# Patient Record
Sex: Female | Born: 1995 | Race: White | Hispanic: No | Marital: Single | Smoking: Current every day smoker
Health system: Southern US, Community
[De-identification: ages and names within clinical notes are randomized; demographics above are authoritative.]

## PROBLEM LIST (undated history)

## (undated) DIAGNOSIS — R9431 Abnormal electrocardiogram [ECG] [EKG]: Secondary | ICD-10-CM

## (undated) HISTORY — PX: MOUTH SURGERY: SHX715

---
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
## 2016-03-03 ENCOUNTER — Emergency Department (HOSPITAL_COMMUNITY)
Admission: EM | Admit: 2016-03-03 | Discharge: 2016-03-03 | Disposition: A | Discharge status: Home / Self Care | Payer: 59 | Attending: Emergency Medicine | Admitting: Emergency Medicine

## 2016-03-03 ENCOUNTER — Encounter (HOSPITAL_COMMUNITY): Payer: Self-pay

## 2016-03-03 DIAGNOSIS — F17210 Nicotine dependence, cigarettes, uncomplicated: Secondary | ICD-10-CM | POA: Insufficient documentation

## 2016-03-03 DIAGNOSIS — Z7982 Long term (current) use of aspirin: Secondary | ICD-10-CM | POA: Diagnosis not present

## 2016-03-03 DIAGNOSIS — G43909 Migraine, unspecified, not intractable, without status migrainosus: Secondary | ICD-10-CM | POA: Diagnosis not present

## 2016-03-03 DIAGNOSIS — Z79899 Other long term (current) drug therapy: Secondary | ICD-10-CM | POA: Diagnosis not present

## 2016-03-03 DIAGNOSIS — H53149 Visual discomfort, unspecified: Secondary | ICD-10-CM | POA: Diagnosis present

## 2016-03-03 DIAGNOSIS — G43009 Migraine without aura, not intractable, without status migrainosus: Secondary | ICD-10-CM

## 2016-03-03 HISTORY — DX: Abnormal electrocardiogram (ECG) (EKG): R94.31

## 2016-03-03 MED ORDER — KETOROLAC TROMETHAMINE 30 MG/ML IJ SOLN
30.0000 mg | Freq: Once | INTRAMUSCULAR | Status: AC
  Administered 2016-03-03: 30 mg via INTRAVENOUS
  Filled 2016-03-03: qty 1

## 2016-03-03 MED ORDER — VALPROATE SODIUM 500 MG/5ML IV SOLN
500.0000 mg | Freq: Once | INTRAVENOUS | Status: AC
  Administered 2016-03-03: 500 mg via INTRAVENOUS
  Filled 2016-03-03: qty 5

## 2016-03-03 MED ORDER — SODIUM CHLORIDE 0.9 % IV BOLUS (SEPSIS)
1000.0000 mL | Freq: Once | INTRAVENOUS | Status: AC
  Administered 2016-03-03: 1000 mL via INTRAVENOUS

## 2016-03-03 MED ORDER — DIPHENHYDRAMINE HCL 50 MG/ML IJ SOLN
25.0000 mg | Freq: Once | INTRAMUSCULAR | Status: AC
  Administered 2016-03-03: 25 mg via INTRAVENOUS
  Filled 2016-03-03: qty 1

## 2016-03-03 NOTE — ED Provider Notes (Signed)
[facility]-EMERGENCY DEPT Provider Note   CSN: [id number] Arrival date & time: 03/03/16  1641  First Provider Contact:  1800       History   Chief Complaint Chief Complaint  Patient presents with  . Migraine  . Facial Pain  . Emesis    HPI RAND BOLLER is a 20 y.o. female.  Resides with a three-day headache. She did intermittent "migraines" over the last several weeks. Mom states her other daughter, the patient's sister has had similar episodes in the interval scheduled apparently does see a neurologist "some time". She describes throbbing frontal headache with light sensitivity and nausea.  Denies facial pain sinus symptoms fever or neck pain or other new or worsening symptoms.  HPI  Past Medical History:  Diagnosis Date  . Prolonged QT interval     There are no active problems to display for this patient.   History reviewed. No pertinent surgical history.  OB History    No data available       Home Medications    Prior to Admission medications   Medication Sig Start Date End Date Taking? Authorizing Provider  aspirin-acetaminophen-caffeine (EXCEDRIN MIGRAINE) [phone] MG tablet Take 1 tablet by mouth every 6 (six) hours as needed for headache.   Yes Historical Provider, MD  nadolol (CORGARD) 80 MG tablet Take 80 mg by mouth daily. 02/14/16 02/13/17 Yes Historical Provider, MD    Family History History reviewed. No pertinent family history.  Social History Social History  Substance Use Topics  . Smoking status: Current Every Day Smoker    Types: Cigarettes  . Smokeless tobacco: Never Used  . Alcohol use Yes     Allergies   Other   Review of Systems Review of Systems  Constitutional: Negative for appetite change, chills, diaphoresis, fatigue and fever.  HENT: Negative for mouth sores, sore throat and trouble swallowing.   Eyes: Positive for photophobia. Negative for visual disturbance.  Respiratory: Negative for cough, chest tightness,  shortness of breath and wheezing.   Cardiovascular: Negative for chest pain.  Gastrointestinal: Positive for nausea. Negative for abdominal distention, abdominal pain, diarrhea and vomiting.  Endocrine: Negative for polydipsia, polyphagia and polyuria.  Genitourinary: Negative for dysuria, frequency and hematuria.  Musculoskeletal: Negative for gait problem.  Skin: Negative for color change, pallor and rash.  Neurological: Positive for headaches. Negative for dizziness, syncope and light-headedness.  Hematological: Does not bruise/bleed easily.  Psychiatric/Behavioral: Negative for behavioral problems and confusion.     Physical Exam Updated Vital Signs BP 115/74 (BP Location: Left Arm)   Pulse 63   Temp 97.8 F (36.6 C)   Resp 18   LMP 02/09/2016   SpO2 100%   Physical Exam  Constitutional: She is oriented to person, place, and time. She appears well-developed and well-nourished. No distress.  HENT:  Head: Normocephalic.  Eyes: Conjunctivae are normal. Pupils are equal, round, and reactive to light. No scleral icterus.  Neck: Normal range of motion. Neck supple. No thyromegaly present.  Cardiovascular: Normal rate and regular rhythm.  Exam reveals no gallop and no friction rub.   No murmur heard. Pulmonary/Chest: Effort normal and breath sounds normal. No respiratory distress. She has no wheezes. She has no rales.  Abdominal: Soft. Bowel sounds are normal. She exhibits no distension. There is no tenderness. There is no rebound.  Musculoskeletal: Normal range of motion.  Neurological: She is alert and oriented to person, place, and time.  Skin: Skin is warm and dry. No rash noted.  Psychiatric:  She has a normal mood and affect. Her behavior is normal.     ED Treatments / Results  Labs (all labs ordered are listed, but only abnormal results are displayed) Labs Reviewed - No data to display  EKG  EKG Interpretation None       Radiology No results  found.  Procedures Procedures (including critical care time)  Medications Ordered in ED Medications  diphenhydrAMINE (BENADRYL) injection 25 mg (25 mg Intravenous Given 03/03/16 2024)  ketorolac (TORADOL) 30 MG/ML injection 30 mg (30 mg Intravenous Given 03/03/16 2025)  valproate (DEPACON) 500 mg in dextrose 5 % 50 mL IVPB (0 mg Intravenous Stopped 03/03/16 2128)  sodium chloride 0.9 % bolus 1,000 mL (0 mLs Intravenous Stopped 03/03/16 2217)     Initial Impression / Assessment and Plan / ED Course  I have reviewed the triage vital signs and the nursing notes.  Pertinent labs & imaging results that were available during my care of the patient were reviewed by me and considered in my medical decision making (see chart for details).  Clinical Course    Given IV Toradol, Benadryl, Depakote 500. Not given antiemetics because of a history of QT prolongation. After the patient was sleeping and symptom free. Discharged home. Neurology and primary care follow-up.  Final Clinical Impressions(s) / ED Diagnoses   Final diagnoses:  Migraine without aura and without status migrainosus, not intractable    New Prescriptions Discharge Medication List as of 03/03/2016 10:31 PM       Rolland Porter, MD 03/03/16 2358

## 2016-03-03 NOTE — Discharge Instructions (Signed)
Benadryl and Naproxen for any recurrence.   Follow up with planned ortho procedure
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
## 2016-04-12 ENCOUNTER — Encounter (HOSPITAL_COMMUNITY): Payer: Self-pay

## 2016-04-12 ENCOUNTER — Emergency Department (HOSPITAL_COMMUNITY)
Admission: EM | Admit: 2016-04-12 | Discharge: 2016-04-13 | Disposition: A | Discharge status: Home / Self Care | Payer: 59 | Attending: Emergency Medicine | Admitting: Emergency Medicine

## 2016-04-12 DIAGNOSIS — Z79899 Other long term (current) drug therapy: Secondary | ICD-10-CM | POA: Diagnosis not present

## 2016-04-12 DIAGNOSIS — F17210 Nicotine dependence, cigarettes, uncomplicated: Secondary | ICD-10-CM | POA: Insufficient documentation

## 2016-04-12 DIAGNOSIS — N939 Abnormal uterine and vaginal bleeding, unspecified: Secondary | ICD-10-CM

## 2016-04-12 LAB — I-STAT CHEM 8, ED
BUN: 7 mg/dL (ref 6–20)
CREATININE: 0.6 mg/dL (ref 0.44–1.00)
Calcium, Ion: 1.16 mmol/L (ref 1.15–1.40)
Chloride: 101 mmol/L (ref 101–111)
Glucose, Bld: 99 mg/dL (ref 65–99)
HEMATOCRIT: 48 % — AB (ref 36.0–46.0)
HEMOGLOBIN: 16.3 g/dL — AB (ref 12.0–15.0)
Potassium: 4 mmol/L (ref 3.5–5.1)
SODIUM: 139 mmol/L (ref 135–145)
TCO2: 27 mmol/L (ref 0–100)

## 2016-04-12 LAB — URINALYSIS, ROUTINE W REFLEX MICROSCOPIC
BILIRUBIN URINE: NEGATIVE
Glucose, UA: NEGATIVE mg/dL
KETONES UR: NEGATIVE mg/dL
NITRITE: NEGATIVE
PROTEIN: NEGATIVE mg/dL
Specific Gravity, Urine: 1.009 (ref 1.005–1.030)
pH: 6 (ref 5.0–8.0)

## 2016-04-12 LAB — URINE MICROSCOPIC-ADD ON: Bacteria, UA: NONE SEEN

## 2016-04-12 LAB — I-STAT BETA HCG BLOOD, ED (MC, WL, AP ONLY)

## 2016-04-12 LAB — WET PREP, GENITAL
CLUE CELLS WET PREP: NONE SEEN
Sperm: NONE SEEN
Trich, Wet Prep: NONE SEEN
Yeast Wet Prep HPF POC: NONE SEEN

## 2016-04-12 NOTE — ED Triage Notes (Signed)
Pt states she has been bleeding since August 19th, clots and both dark and bright red, pt states that her last regular period was the first week of August

## 2016-04-12 NOTE — ED Provider Notes (Signed)
[facility]-EMERGENCY DEPT Provider Note   CSN: [id number] Arrival date & time: 04/12/16  2004   By signing my name below, I, Christy Sartorius, attest that this documentation has been prepared under the direction and in the presence of [employer], PA-C. Electronically Signed: Christy Sartorius, ED Scribe. 04/12/16. 11:20 PM.  History   Chief Complaint Chief Complaint  Patient presents with  . Vaginal Bleeding   The history is provided by the patient and medical records. No language interpreter was used.   HPI Comments:  Sandy Rose is a 20 y.o. female with a history of polycystic ovaries brought in by ambulance, who presents to the Emergency Department complaining of constant vaginal bleeding for the last two and a half weeks.  She's used about two super tampons an hour.  She also notes she began having intermittent cramping abdominal pain 3 days before the start of the bleeding.  She denies ever having unilateral abdominal pain. She states it initially prevented her from having sex, but it is now less intense.  She states that it initially felt similar to an ovarian cyst, but resolved prior to the onset of bleeding.  She takes ibuprofen with relief of her associated centralized cramping.  Her last menstrual cycle was on 03/11/16.  She saw her OBGYN in June and had a bacterial infection that was treated with antibiotic gel.  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  Patient denies taking other medications at this time. She denies aspirin or blood thinners. No additional symptoms or complaints.    Past Medical History:  Diagnosis Date  . Prolonged QT interval     There are no active problems to display for this patient.   History reviewed. No pertinent surgical history.  OB History    No data available       Home Medications    Prior to Admission medications   Medication Sig Start  Date End Date Taking? Authorizing Provider  alum & mag hydroxide-simeth (MAALOX PLUS) 400-400-40 MG/5ML suspension Take 10 mLs by mouth every 6 (six) hours as needed for indigestion.   Yes Historical Provider, MD  anti-nausea (EMETROL) solution Take 10 mLs by mouth every 15 (fifteen) minutes as needed for nausea or vomiting.   Yes Historical Provider, MD  benzonatate (TESSALON) 100 MG capsule Take 100 mg by mouth 3 (three) times daily as needed for cough.   Yes Historical Provider, MD  diazepam (VALIUM) 5 MG/ML injection Inject 10 mg into the vein once. As needed for seizure   Yes Historical Provider, MD  folic acid (FOLVITE) 1 MG tablet Take 1 mg by mouth daily.   Yes Historical Provider, MD  guaiFENesin (MUCINEX) 600 MG 12 hr tablet Take 600 mg by mouth 2 (two) times daily.   Yes Historical Provider, MD  ibuprofen (ADVIL,MOTRIN) 200 MG tablet Take 400 mg by mouth every 4 (four) hours as needed for moderate pain.   Yes Historical Provider, MD  loperamide (IMODIUM A-D) 2 MG tablet Take 2 mg by mouth 4 (four) times daily as needed for diarrhea or loose stools.   Yes Historical Provider, MD  Melatonin 5 MG TABS Take 2 tablets by mouth daily.   Yes Historical Provider, MD  Multiple Vitamin (MULTIVITAMIN WITH MINERALS) TABS tablet Take 1 tablet by mouth daily.   Yes Historical Provider, MD  nystatin (MYCOSTATIN) 100000 UNIT/ML suspension Take 6 mLs by mouth 4 (four)  times daily.   Yes Historical Provider, MD  thiamine 100 MG tablet Take 100 mg by mouth daily.   Yes Historical Provider, MD  medroxyPROGESTERone (PROVERA) 5 MG tablet Take 1 tablet (5 mg total) by mouth daily. 04/13/16   Dahlia ClientHannah Lashante Fryberger, PA-C    Family History History reviewed. No pertinent family history.  Social History Social History  Substance Use Topics  . Smoking status: Current Every Day Smoker    Types: Cigarettes  . Smokeless tobacco: Never Used  . Alcohol use Yes     Allergies   Other   Review of Systems Review  of Systems  Constitutional: Negative for chills and fever.  Gastrointestinal: Positive for abdominal pain ( Cramping).  Genitourinary: Positive for vaginal bleeding. Negative for dysuria, flank pain, pelvic pain and vaginal discharge.  All other systems reviewed and are negative.    Physical Exam Updated Vital Signs BP 131/87 (BP Location: Right Arm)   Pulse 82   Temp 97.9 F (36.6 C) (Oral)   Resp 19   Ht 5\' 7"  (1.702 m)   Wt 122 lb 8 oz (55.6 kg)   LMP 03/08/2016   SpO2 100%   BMI 19.19 kg/m   Physical Exam  Constitutional: She appears well-developed and well-nourished. No distress.  HENT:  Head: Normocephalic and atraumatic.  Eyes: Conjunctivae are normal.  Neck: Normal range of motion.  Cardiovascular: Normal rate, regular rhythm, normal heart sounds and intact distal pulses.   No murmur heard. Pulmonary/Chest: Effort normal and breath sounds normal. No respiratory distress. She has no wheezes.  Abdominal: Soft. Bowel sounds are normal. There is no tenderness. There is no rebound and no guarding. Hernia confirmed negative in the right inguinal area and confirmed negative in the left inguinal area.  Genitourinary: Uterus normal. No labial fusion. There is no rash, tenderness or lesion on the right labia. There is no rash, tenderness or lesion on the left labia. Uterus is not deviated, not enlarged, not fixed and not tender. Cervix exhibits no motion tenderness, no discharge and no friability. Right adnexum displays no mass, no tenderness and no fullness. Left adnexum displays no mass, no tenderness and no fullness. There is bleeding ( Very small amount of red blood in the vaginal vault, no clots.) in the vagina. No erythema or tenderness in the vagina. No foreign body in the vagina. No signs of injury around the vagina. No vaginal discharge ( ) found.  Musculoskeletal: Normal range of motion. She exhibits no edema.  Lymphadenopathy:       Right: No inguinal adenopathy present.         Left: No inguinal adenopathy present.  Neurological: She is alert.  Skin: Skin is warm and dry. She is not diaphoretic. No erythema.  Psychiatric: She has a normal mood and affect.  Nursing note and vitals reviewed.    ED Treatments / Results   DIAGNOSTIC STUDIES:  Oxygen Saturation is 100% on RA, NML by my interpretation.    COORDINATION OF CARE:  11:20 PM Discussed treatment plan with pt at bedside and pt agreed to plan.  Labs (all labs ordered are listed, but only abnormal results are displayed) Labs Reviewed  WET PREP, GENITAL - Abnormal; Notable for the following:       Result Value   WBC, Wet Prep HPF POC FEW (*)    All other components within normal limits  URINALYSIS, ROUTINE W REFLEX MICROSCOPIC (NOT AT [facility]) - Abnormal; Notable for the following:    Hgb urine  dipstick LARGE (*)    Leukocytes, UA SMALL (*)    All other components within normal limits  URINE MICROSCOPIC-ADD ON - Abnormal; Notable for the following:    Squamous Epithelial / LPF 0-5 (*)    All other components within normal limits  I-STAT CHEM 8, ED - Abnormal; Notable for the following:    Hemoglobin 16.3 (*)    HCT 48.0 (*)    All other components within normal limits  I-STAT BETA HCG BLOOD, ED ([facility], [facility], [facility] ONLY)  GC/CHLAMYDIA PROBE AMP ([place]) NOT AT [facility]    Procedures Procedures (including critical care time)  Medications Ordered in ED Medications - No data to display   Initial Impression / Assessment and Plan / ED Course  I have reviewed the triage vital signs and the nursing notes.  Pertinent labs & imaging results that were available during my care of the patient were reviewed by me and considered in my medical decision making (see chart for details).  Clinical Course  Value Comment By Time  Hemoglobin: (!) 16.3 No anemia. Dahlia Client Demetry Bendickson, PA-C 09/06 2324  WBC, Wet Prep HPF POC: (!) FEW Wet prep with only a few WBCs and no clinical evidence of vaginal infection  Dierdre Forth, PA-C 09/06 2324  Leukocytes, UA: (!) SMALL Small leukocytes, but no nitrites and no urinary symptoms.  Microscopic with 0-5 WBCs Chaska Hagger, PA-C 09/06 2325   Abdomen remained soft and nontender on repeat exam.  Vital signs remained stable Dierdre Forth, PA-C 09/07 0026  I-stat hCG, quantitative: <5.0 Negative pregnancy test Dierdre Forth, PA-C 09/07 0029    Pt with vaginal bleeding. Patient reports significant amounts however minimal noted in the vaginal vault. Abdomen is soft and nontender. Pregnancy test negative. Patient without anemia. Will be given short course of medroxyprogesterone.  She is to follow-up with her OB/GYN as soon as possible.  Discussed findings with patient and mother who state understanding and are in agreement with the plan.  Final Clinical Impressions(s) / ED Diagnoses   Final diagnoses:  Vaginal bleeding    New Prescriptions New Prescriptions   MEDROXYPROGESTERONE (PROVERA) 5 MG TABLET    Take 1 tablet (5 mg total) by mouth daily.   I personally performed the services described in this documentation, which was scribed in my presence. The recorded information has been reviewed and is accurate.     Dahlia Client Telisha Zawadzki, PA-C 04/13/16 0030    Benjiman Core, MD 04/14/16 [phone]

## 2016-04-12 NOTE — ED Triage Notes (Signed)
Pt is inpatient currently at [facility] and will return there upon discharge

## 2016-04-12 NOTE — ED Triage Notes (Signed)
Per EMS, pt complains of vaginal bleeding and cramping for 2.5 weeks. Pt states the blood is bright red. [facility] is from [facility], had blood work performed a week ago, which showed hgb within normal limits. Pt has hx of polycystic ovaries.

## 2016-04-13 LAB — GC/CHLAMYDIA PROBE AMP (~~LOC~~) NOT AT ARMC
Chlamydia: NEGATIVE
NEISSERIA GONORRHEA: NEGATIVE

## 2016-04-13 MED ORDER — MEDROXYPROGESTERONE ACETATE 5 MG PO TABS: 5.0000 mg | ORAL_TABLET | Freq: Every day | ORAL | 0 refills | Status: DC

## 2016-04-13 NOTE — Discharge Instructions (Signed)
1. Medications: Hydroxyprogesterone, usual home medications 2. Treatment: rest, drink plenty of fluids,  3. Follow Up: Please followup with your OB/GYN in 7-10 days for discussion of your diagnoses and further evaluation after today's visit; if you do not have a primary care doctor use the resource guide provided to find one; Please return to the ER for worsening vaginal bleeding, development of abdominal pain or other concerns

## 2016-08-16 DIAGNOSIS — N911 Secondary amenorrhea: Secondary | ICD-10-CM | POA: Diagnosis not present

## 2016-08-31 DIAGNOSIS — N911 Secondary amenorrhea: Secondary | ICD-10-CM | POA: Diagnosis not present

## 2016-09-06 DIAGNOSIS — Z3685 Encounter for antenatal screening for Streptococcus B: Secondary | ICD-10-CM | POA: Diagnosis not present

## 2016-09-06 DIAGNOSIS — Z23 Encounter for immunization: Secondary | ICD-10-CM | POA: Diagnosis not present

## 2016-09-06 DIAGNOSIS — Z3401 Encounter for supervision of normal first pregnancy, first trimester: Secondary | ICD-10-CM | POA: Diagnosis not present

## 2016-09-06 DIAGNOSIS — Z3480 Encounter for supervision of other normal pregnancy, unspecified trimester: Secondary | ICD-10-CM | POA: Diagnosis not present

## 2016-09-06 LAB — OB RESULTS CONSOLE ANTIBODY SCREEN: Antibody Screen: NEGATIVE

## 2016-09-06 LAB — OB RESULTS CONSOLE ABO/RH: RH Type: POSITIVE

## 2016-09-06 LAB — OB RESULTS CONSOLE HEPATITIS B SURFACE ANTIGEN: Hepatitis B Surface Ag: NEGATIVE

## 2016-09-06 LAB — OB RESULTS CONSOLE RUBELLA ANTIBODY, IGM: RUBELLA: IMMUNE

## 2016-09-06 LAB — OB RESULTS CONSOLE HIV ANTIBODY (ROUTINE TESTING): HIV: NONREACTIVE

## 2016-09-06 LAB — OB RESULTS CONSOLE RPR: RPR: NONREACTIVE

## 2016-09-12 DIAGNOSIS — I4581 Long QT syndrome: Secondary | ICD-10-CM | POA: Diagnosis not present

## 2016-09-14 DIAGNOSIS — Z01419 Encounter for gynecological examination (general) (routine) without abnormal findings: Secondary | ICD-10-CM | POA: Diagnosis not present

## 2016-09-14 DIAGNOSIS — Z113 Encounter for screening for infections with a predominantly sexual mode of transmission: Secondary | ICD-10-CM | POA: Diagnosis not present

## 2016-09-14 DIAGNOSIS — Z3A10 10 weeks gestation of pregnancy: Secondary | ICD-10-CM | POA: Diagnosis not present

## 2016-09-14 DIAGNOSIS — Z3400 Encounter for supervision of normal first pregnancy, unspecified trimester: Secondary | ICD-10-CM | POA: Diagnosis not present

## 2016-09-14 DIAGNOSIS — Z3480 Encounter for supervision of other normal pregnancy, unspecified trimester: Secondary | ICD-10-CM | POA: Diagnosis not present

## 2016-09-28 DIAGNOSIS — Z3682 Encounter for antenatal screening for nuchal translucency: Secondary | ICD-10-CM | POA: Diagnosis not present

## 2016-09-28 DIAGNOSIS — Z3491 Encounter for supervision of normal pregnancy, unspecified, first trimester: Secondary | ICD-10-CM | POA: Diagnosis not present

## 2016-09-28 DIAGNOSIS — Z13228 Encounter for screening for other metabolic disorders: Secondary | ICD-10-CM | POA: Diagnosis not present

## 2016-09-28 DIAGNOSIS — Z36 Encounter for antenatal screening for chromosomal anomalies: Secondary | ICD-10-CM | POA: Diagnosis not present

## 2016-10-30 ENCOUNTER — Encounter: Payer: Self-pay | Admitting: Emergency Medicine

## 2016-10-30 ENCOUNTER — Emergency Department (HOSPITAL_COMMUNITY)
Admission: EM | Admit: 2016-10-30 | Discharge: 2016-10-30 | Disposition: A | Discharge status: Home / Self Care | Payer: BLUE CROSS/BLUE SHIELD | Attending: Emergency Medicine | Admitting: Emergency Medicine

## 2016-10-30 DIAGNOSIS — O9989 Other specified diseases and conditions complicating pregnancy, childbirth and the puerperium: Secondary | ICD-10-CM | POA: Diagnosis not present

## 2016-10-30 DIAGNOSIS — R200 Anesthesia of skin: Secondary | ICD-10-CM | POA: Diagnosis present

## 2016-10-30 DIAGNOSIS — G510 Bell's palsy: Secondary | ICD-10-CM

## 2016-10-30 DIAGNOSIS — F17210 Nicotine dependence, cigarettes, uncomplicated: Secondary | ICD-10-CM | POA: Insufficient documentation

## 2016-10-30 DIAGNOSIS — Z79899 Other long term (current) drug therapy: Secondary | ICD-10-CM | POA: Diagnosis not present

## 2016-10-30 DIAGNOSIS — Z3A16 16 weeks gestation of pregnancy: Secondary | ICD-10-CM | POA: Diagnosis not present

## 2016-10-30 MED ORDER — VALACYCLOVIR HCL 1 G PO TABS: 1000.0000 mg | ORAL_TABLET | Freq: Three times a day (TID) | ORAL | 0 refills | Status: AC

## 2016-10-30 MED ORDER — HYPROMELLOSE (GONIOSCOPIC) 2.5 % OP SOLN: 1.0000 [drp] | OPHTHALMIC | 2 refills | Status: DC | PRN

## 2016-10-30 NOTE — ED Triage Notes (Signed)
Pt reports right facial numbness and the right side of her face is drooping, onset 2 days ago. Right eyelid does appear lower than left. Denies weakness, numbness or tingling to arms or legs. She states she feels like it is affecting her speech.

## 2016-10-30 NOTE — ED Notes (Signed)
Pts eyes appear different when she closes both of them and she reports food has been tasting different today. Dr. Effie ShyWentz called and informed of pts symptoms. He states it sounds like Bells Palsy. No new verbal orders at this time.

## 2016-10-30 NOTE — ED Notes (Signed)
Pt reports she is [redacted] weeks pregnant.

## 2016-10-30 NOTE — ED Provider Notes (Signed)
[facility]-EMERGENCY DEPT Provider Note   CSN: [id number] Arrival date & time: 10/30/16  1746  History   Chief Complaint Chief Complaint  Patient presents with  . Numbness    HPI Sandy Rose is a 21 y.o. female who presents to the Emergency Department with worsening, constant right-sided facial paralysis that began suddenly 2 days ago. No aggravating or alleviating factors. She denies HA, visual changes, otalgia, SOB, CP, abdominal pain, or rash. No recent illnesses or tick bites. No treatment PTA.   PMH includes prolonged QT syndrome. NKA. She is currently [redacted] weeks pregnant. OBGYN is Dr. Renaldo Fiddler at [facility].    HPI  Past Medical History:  Diagnosis Date  . Prolonged QT interval     There are no active problems to display for this patient.   No past surgical history on file.  OB History    Gravida Para Term Preterm AB Living   1             SAB TAB Ectopic Multiple Live Births                   Home Medications    Prior to Admission medications   Medication Sig Start Date End Date Taking? Authorizing Provider  alum & mag hydroxide-simeth (MAALOX PLUS) 400-400-40 MG/5ML suspension Take 10 mLs by mouth every 6 (six) hours as needed for indigestion.    Historical Provider, MD  anti-nausea (EMETROL) solution Take 10 mLs by mouth every 15 (fifteen) minutes as needed for nausea or vomiting.    Historical Provider, MD  benzonatate (TESSALON) 100 MG capsule Take 100 mg by mouth 3 (three) times daily as needed for cough.    Historical Provider, MD  diazepam (VALIUM) 5 MG/ML injection Inject 10 mg into the vein once. As needed for seizure    Historical Provider, MD  folic acid (FOLVITE) 1 MG tablet Take 1 mg by mouth daily.    Historical Provider, MD  guaiFENesin (MUCINEX) 600 MG 12 hr tablet Take 600 mg by mouth 2 (two) times daily.    Historical Provider, MD  hydroxypropyl methylcellulose / hypromellose (ISOPTO TEARS / GONIOVISC) 2.5 % ophthalmic solution Place 1  drop into the right eye as needed for dry eyes. 10/30/16   Lilya Smitherman Conan Bowens, PA-C  ibuprofen (ADVIL,MOTRIN) 200 MG tablet Take 400 mg by mouth every 4 (four) hours as needed for moderate pain.    Historical Provider, MD  loperamide (IMODIUM A-D) 2 MG tablet Take 2 mg by mouth 4 (four) times daily as needed for diarrhea or loose stools.    Historical Provider, MD  medroxyPROGESTERone (PROVERA) 5 MG tablet Take 1 tablet (5 mg total) by mouth daily. 04/13/16   Hannah Muthersbaugh, PA-C  Melatonin 5 MG TABS Take 2 tablets by mouth daily.    Historical Provider, MD  Multiple Vitamin (MULTIVITAMIN WITH MINERALS) TABS tablet Take 1 tablet by mouth daily.    Historical Provider, MD  nystatin (MYCOSTATIN) 100000 UNIT/ML suspension Take 6 mLs by mouth 4 (four) times daily.    Historical Provider, MD  thiamine 100 MG tablet Take 100 mg by mouth daily.    Historical Provider, MD  valACYclovir (VALTREX) 1000 MG tablet Take 1 tablet (1,000 mg total) by mouth 3 (three) times daily. 10/30/16 11/06/16  Altonio Schwertner Conan Bowens, PA-C    Family History No family history on file.  Social History Social History  Substance Use Topics  . Smoking status: Current Every Day Smoker  Types: Cigarettes  . Smokeless tobacco: Never Used  . Alcohol use Yes     Allergies   Other   Review of Systems Review of Systems  Constitutional: Negative for chills and fever.  HENT: Positive for congestion. Negative for ear pain, mouth sores, rhinorrhea and sinus pain.   Eyes: Negative for pain.  Respiratory: Negative for shortness of breath.   Gastrointestinal: Negative for abdominal pain, diarrhea, nausea and vomiting.  Genitourinary: Negative for dysuria.  Musculoskeletal: Negative for neck stiffness.  Skin: Negative for rash.  Allergic/Immunologic: Negative for immunocompromised state.  Neurological: Positive for facial asymmetry and numbness. Negative for dizziness, syncope, speech difficulty, weakness, light-headedness and  headaches.  Psychiatric/Behavioral: Negative for confusion.   Physical Exam Updated Vital Signs BP (!) 98/57 (BP Location: Right Arm)   Pulse 62   Temp 98.4 F (36.9 C) (Oral)   Resp 18   LMP 06/27/2016   SpO2 100%   Physical Exam  Constitutional: She is oriented to person, place, and time. She appears well-developed and well-nourished. No distress.  HENT:  Head: Normocephalic and atraumatic.  Right Ear: Hearing, tympanic membrane and external ear normal.  Left Ear: Hearing, tympanic membrane and external ear normal.  Eyes: Conjunctivae and EOM are normal. Pupils are equal, round, and reactive to light.  Neck: Neck supple.  Cardiovascular: Normal rate, regular rhythm and normal heart sounds.  Exam reveals no gallop and no friction rub.   No murmur heard. Pulmonary/Chest: Effort normal.  Abdominal: Soft. There is no tenderness.  Musculoskeletal: She exhibits no edema.  Neurological: She is alert and oriented to person, place, and time. A cranial nerve deficit and sensory deficit is present. Coordination normal.  Right-sided deficit to sharp and light sensation over the distribution of the right Facial nerve. Decreased smile on the right. The patient is unable to close the right eye. Decreased ability to raise the right eyebrow. No deficits to the left side.   5/5 strength to the bilateral upper and lower extremities. Normal gait. No other focal neuro deficits.   Skin: Skin is warm and dry.  Psychiatric: She has a normal mood and affect.  Nursing note and vitals reviewed.  ED Treatments / Results  Labs (all labs ordered are listed, but only abnormal results are displayed) Labs Reviewed - No data to display  EKG  EKG Interpretation None      Radiology No results found.  Procedures Procedures (including critical care time)  Medications Ordered in ED Medications - No data to display  Initial Impression / Assessment and Plan / ED Course  I have reviewed the triage  vital signs and the nursing notes.  Pertinent labs & imaging results that were available during my care of the patient were reviewed by me and considered in my medical decision making (see chart for details).     21 y.o. female with Bell's Palsy. Low suspicion for central nervous system lesion based on physical exam. She is currently [redacted] weeks pregnant. Will discharge with valacyclovir, artificial tears, and follow-up with OBGYN in one week. Discussed the patient with Dr. Erma HeritageIsaacs who was agreeable with the plan. Discussed reasons to return to the Emergency Department with the patient. She acknowledged understanding and agreement to the plan.   Final Clinical Impressions(s) / ED Diagnoses   Final diagnoses:  Bell's palsy    New Prescriptions Discharge Medication List as of 10/30/2016  8:07 PM    START taking these medications   Details  hydroxypropyl methylcellulose / hypromellose (ISOPTO  TEARS / GONIOVISC) 2.5 % ophthalmic solution Place 1 drop into the right eye as needed for dry eyes., Starting Mon 10/30/2016, Print    valACYclovir (VALTREX) 1000 MG tablet Take 1 tablet (1,000 mg total) by mouth 3 (three) times daily., Starting Mon 10/30/2016, Until Mon 11/06/2016, Print         Lezly Rumpf Conan Bowens, PA-C 10/31/16 0011    Shaune Pollack, MD 10/31/16 [phone]

## 2016-11-16 DIAGNOSIS — Z363 Encounter for antenatal screening for malformations: Secondary | ICD-10-CM | POA: Diagnosis not present

## 2016-11-23 DIAGNOSIS — O358XX0 Maternal care for other (suspected) fetal abnormality and damage, not applicable or unspecified: Secondary | ICD-10-CM | POA: Diagnosis not present

## 2016-11-23 DIAGNOSIS — I4581 Long QT syndrome: Secondary | ICD-10-CM | POA: Diagnosis not present

## 2016-11-23 DIAGNOSIS — O99412 Diseases of the circulatory system complicating pregnancy, second trimester: Secondary | ICD-10-CM | POA: Diagnosis not present

## 2016-11-23 DIAGNOSIS — Z3A20 20 weeks gestation of pregnancy: Secondary | ICD-10-CM | POA: Diagnosis not present

## 2016-11-24 DIAGNOSIS — J019 Acute sinusitis, unspecified: Secondary | ICD-10-CM | POA: Diagnosis not present

## 2016-12-19 DIAGNOSIS — O4402 Placenta previa specified as without hemorrhage, second trimester: Secondary | ICD-10-CM | POA: Diagnosis not present

## 2016-12-19 DIAGNOSIS — Z3A23 23 weeks gestation of pregnancy: Secondary | ICD-10-CM | POA: Diagnosis not present

## 2017-01-05 DIAGNOSIS — N760 Acute vaginitis: Secondary | ICD-10-CM | POA: Diagnosis not present

## 2017-01-08 DIAGNOSIS — N760 Acute vaginitis: Secondary | ICD-10-CM | POA: Diagnosis not present

## 2017-01-08 DIAGNOSIS — R8271 Bacteriuria: Secondary | ICD-10-CM | POA: Diagnosis not present

## 2017-01-17 DIAGNOSIS — Z3480 Encounter for supervision of other normal pregnancy, unspecified trimester: Secondary | ICD-10-CM | POA: Diagnosis not present

## 2017-02-22 ENCOUNTER — Ambulatory Visit (INDEPENDENT_AMBULATORY_CARE_PROVIDER_SITE_OTHER): Payer: Self-pay | Admitting: Pediatrics

## 2017-02-22 DIAGNOSIS — Z7681 Expectant parent(s) prebirth pediatrician visit: Secondary | ICD-10-CM

## 2017-02-27 NOTE — Progress Notes (Signed)
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  Will need to contact [facility] for guidance.  Cord blood for mutation specific testing at birth, EKG at birth and 24hrs and after, likely starting beta blocker.   Z76.81

## 2017-03-05 DIAGNOSIS — O365930 Maternal care for other known or suspected poor fetal growth, third trimester, not applicable or unspecified: Secondary | ICD-10-CM | POA: Diagnosis not present

## 2017-03-05 DIAGNOSIS — Z3A34 34 weeks gestation of pregnancy: Secondary | ICD-10-CM | POA: Diagnosis not present

## 2017-03-20 ENCOUNTER — Inpatient Hospital Stay (HOSPITAL_COMMUNITY): Payer: BLUE CROSS/BLUE SHIELD

## 2017-03-20 ENCOUNTER — Encounter (HOSPITAL_COMMUNITY): Payer: Self-pay

## 2017-03-20 ENCOUNTER — Inpatient Hospital Stay (EMERGENCY_DEPARTMENT_HOSPITAL)
Admission: AD | Admit: 2017-03-20 | Discharge: 2017-03-20 | Disposition: A | Discharge status: Home / Self Care | Payer: BLUE CROSS/BLUE SHIELD | Source: Ambulatory Visit | Attending: Obstetrics and Gynecology | Admitting: Obstetrics and Gynecology

## 2017-03-20 DIAGNOSIS — Z881 Allergy status to other antibiotic agents status: Secondary | ICD-10-CM | POA: Insufficient documentation

## 2017-03-20 DIAGNOSIS — O368390 Maternal care for abnormalities of the fetal heart rate or rhythm, unspecified trimester, not applicable or unspecified: Secondary | ICD-10-CM | POA: Diagnosis not present

## 2017-03-20 DIAGNOSIS — Z91040 Latex allergy status: Secondary | ICD-10-CM | POA: Diagnosis not present

## 2017-03-20 DIAGNOSIS — O99334 Smoking (tobacco) complicating childbirth: Secondary | ICD-10-CM | POA: Diagnosis not present

## 2017-03-20 DIAGNOSIS — Z3480 Encounter for supervision of other normal pregnancy, unspecified trimester: Secondary | ICD-10-CM | POA: Diagnosis not present

## 2017-03-20 DIAGNOSIS — F17210 Nicotine dependence, cigarettes, uncomplicated: Secondary | ICD-10-CM | POA: Diagnosis not present

## 2017-03-20 DIAGNOSIS — O99333 Smoking (tobacco) complicating pregnancy, third trimester: Secondary | ICD-10-CM | POA: Insufficient documentation

## 2017-03-20 DIAGNOSIS — Z3A36 36 weeks gestation of pregnancy: Secondary | ICD-10-CM | POA: Diagnosis not present

## 2017-03-20 DIAGNOSIS — O368330 Maternal care for abnormalities of the fetal heart rate or rhythm, third trimester, not applicable or unspecified: Secondary | ICD-10-CM | POA: Diagnosis not present

## 2017-03-20 DIAGNOSIS — Z3A37 37 weeks gestation of pregnancy: Secondary | ICD-10-CM | POA: Diagnosis not present

## 2017-03-20 DIAGNOSIS — O368993 Maternal care for other specified fetal problems, unspecified trimester, fetus 3: Secondary | ICD-10-CM

## 2017-03-20 DIAGNOSIS — Z3689 Encounter for other specified antenatal screening: Secondary | ICD-10-CM

## 2017-03-20 DIAGNOSIS — O9942 Diseases of the circulatory system complicating childbirth: Secondary | ICD-10-CM | POA: Diagnosis not present

## 2017-03-20 DIAGNOSIS — Z9109 Other allergy status, other than to drugs and biological substances: Secondary | ICD-10-CM

## 2017-03-20 DIAGNOSIS — I4581 Long QT syndrome: Secondary | ICD-10-CM | POA: Diagnosis not present

## 2017-03-20 DIAGNOSIS — O365930 Maternal care for other known or suspected poor fetal growth, third trimester, not applicable or unspecified: Secondary | ICD-10-CM

## 2017-03-20 DIAGNOSIS — Z113 Encounter for screening for infections with a predominantly sexual mode of transmission: Secondary | ICD-10-CM | POA: Diagnosis not present

## 2017-03-20 DIAGNOSIS — I341 Nonrheumatic mitral (valve) prolapse: Secondary | ICD-10-CM | POA: Diagnosis not present

## 2017-03-20 NOTE — Discharge Instructions (Signed)

## 2017-03-20 NOTE — MAU Note (Signed)
Patient seen in OB office. FHR 5 min deceleration in office. Sent for prolonged monitoring.  Patient denies any complaints at this time.

## 2017-03-20 NOTE — MAU Provider Note (Signed)
History     CSN: [id number]  Arrival date and time: 03/20/17 1759  First Provider Initiated Contact with Patient 03/20/17 1834      Chief Complaint  Patient presents with  . Non-stress Test   HPI Sandy Rose is a 21 y.o. G1P0 at 7258w6d who presents for prolonged monitoring. Patient in office today; had 5 minute prolonged decel on NST. States ultrasound today showed baby measuring same as 2 weeks ago (4lb 8oz). Per office nurse who called report, patient IUGR & measuring 2nd percentile.  Denies abdominal pain, vaginal bleeding, or LOF. Positive fetal movement.    OB History    Gravida Para Term Preterm AB Living   1             SAB TAB Ectopic Multiple Live Births                  Past Medical History:  Diagnosis Date  . Prolonged QT interval     Past Surgical History:  Procedure Laterality Date  . MOUTH SURGERY      No family history on file.  Social History  Substance Use Topics  . Smoking status: Current Every Day Smoker    Packs/day: 0.50    Types: Cigarettes  . Smokeless tobacco: Never Used  . Alcohol use Yes    Allergies:  Allergies  Allergen Reactions  . Erythromycin     Cardiac arrest  . Other Other (See Comments)    Long Q-T syndrome - Please access web site for list of drugs to avoid:  http://www.sads.org/living-with-sads/Drugs-[email or web address]-Avoid?gclid=[email or web address]    Prescriptions Prior to Admission  Medication Sig Dispense Refill Last Dose  . nadolol (CORGARD) 80 MG tablet Take 80 mg by mouth daily.   03/19/2017 at 1800  . prenatal vitamin w/FE, FA (NATACHEW) 29-1 MG CHEW chewable tablet Chew 1 tablet by mouth 2 (two) times daily.     Marland Kitchen. alum & mag hydroxide-simeth (MAALOX PLUS) 400-400-40 MG/5ML suspension Take 10 mLs by mouth every 6 (six) hours as needed for indigestion.   unknown at unknown  . anti-nausea (EMETROL) solution Take 10 mLs by mouth every 15 (fifteen) minutes as needed for nausea or vomiting.   unknown at unknown  .  benzonatate (TESSALON) 100 MG capsule Take 100 mg by mouth 3 (three) times daily as needed for cough.   unknown at unknown  . diazepam (VALIUM) 5 MG/ML injection Inject 10 mg into the vein once. As needed for seizure   unknown at unknown  . folic acid (FOLVITE) 1 MG tablet Take 1 mg by mouth daily.   04/12/2016 at 0900  . guaiFENesin (MUCINEX) 600 MG 12 hr tablet Take 600 mg by mouth 2 (two) times daily.   unknown at unknown  . hydroxypropyl methylcellulose / hypromellose (ISOPTO TEARS / GONIOVISC) 2.5 % ophthalmic solution Place 1 drop into the right eye as needed for dry eyes. 15 mL 2   . ibuprofen (ADVIL,MOTRIN) 200 MG tablet Take 400 mg by mouth every 4 (four) hours as needed for moderate pain.   04/12/2016 at 0900  . loperamide (IMODIUM A-D) 2 MG tablet Take 2 mg by mouth 4 (four) times daily as needed for diarrhea or loose stools.   unknown at unknown  . medroxyPROGESTERone (PROVERA) 5 MG tablet Take 1 tablet (5 mg total) by mouth daily. 5 tablet 0   . Melatonin 5 MG TABS Take 2 tablets by mouth daily.   04/11/2016 at 2100  . Multiple Vitamin (MULTIVITAMIN WITH MINERALS)  TABS tablet Take 1 tablet by mouth daily.   04/12/2016 at 0900  . nystatin (MYCOSTATIN) 100000 UNIT/ML suspension Take 6 mLs by mouth 4 (four) times daily.   04/11/2016 at 2100  . thiamine 100 MG tablet Take 100 mg by mouth daily.   04/12/2016 at 0900    Review of Systems  Constitutional: Negative.   Gastrointestinal: Negative.   Genitourinary: Negative.    Physical Exam   Blood pressure (!) 120/59, pulse 77, temperature 97.9 F (36.6 C), temperature source Oral, resp. rate 17, last menstrual period 06/27/2016, SpO2 100 %.  Physical Exam  Nursing note and vitals reviewed. Constitutional: She is oriented to person, place, and time. She appears well-developed and well-nourished. No distress.  HENT:  Head: Normocephalic and atraumatic.  Eyes: Conjunctivae are normal. Right eye exhibits discharge. Left eye exhibits no discharge.  No scleral icterus.  Neck: Normal range of motion.  Respiratory: Effort normal. No respiratory distress.  Neurological: She is alert and oriented to person, place, and time.  Skin: She is not diaphoretic.  Psychiatric: She has a normal mood and affect. Her behavior is normal. Judgment and thought content normal.   Fetal Tracing:  Baseline: 120 Variability: moderate Accelerations: 15x15 Decelerations: none  Toco: irr ctx MAU Course  Procedures No results found for this or any previous visit (from the past 24 hour(s)).  MDM Reactive NST S/w Dr. Rana Snare. Will get BPP & cord doppler  BPP 8/8 with normal AFI. Normal cord doppler study. FHT reactive upon return from ultrasound.  Reviewed results and tracing with Dr. Rana Snare. Ok to discharge home. Pt to f/u in office tomorrow for NST Assessment and Plan  A: 1. NST (non-stress test) reactive   2. Fetal heart rate decelerations affecting management of mother   3. [redacted] weeks gestation of pregnancy   4. IUGR (intrauterine growth restriction) affecting care of mother, third trimester, not applicable or unspecified fetus    P; Discharge home Discussed reasons to return to MAU F/u in office tomorrow for NST -- inform them that BPP & doppler study normal tonight

## 2017-03-21 DIAGNOSIS — Z3A37 37 weeks gestation of pregnancy: Secondary | ICD-10-CM | POA: Diagnosis not present

## 2017-03-21 DIAGNOSIS — O368330 Maternal care for abnormalities of the fetal heart rate or rhythm, third trimester, not applicable or unspecified: Secondary | ICD-10-CM | POA: Diagnosis not present

## 2017-03-23 ENCOUNTER — Inpatient Hospital Stay (HOSPITAL_COMMUNITY): Payer: BLUE CROSS/BLUE SHIELD | Admitting: Anesthesiology

## 2017-03-23 ENCOUNTER — Encounter (HOSPITAL_COMMUNITY): Payer: Self-pay | Admitting: *Deleted

## 2017-03-23 ENCOUNTER — Encounter (HOSPITAL_COMMUNITY)
Admission: AD | Disposition: A | Discharge status: Home / Self Care | Payer: Self-pay | Source: Ambulatory Visit | Attending: Obstetrics and Gynecology

## 2017-03-23 ENCOUNTER — Inpatient Hospital Stay (HOSPITAL_COMMUNITY)
Admission: AD | Admit: 2017-03-23 | Discharge: 2017-03-26 | DRG: 765 | Disposition: A | Discharge status: Home / Self Care | Payer: BLUE CROSS/BLUE SHIELD | Source: Ambulatory Visit | Attending: Obstetrics and Gynecology | Admitting: Obstetrics and Gynecology

## 2017-03-23 DIAGNOSIS — O99334 Smoking (tobacco) complicating childbirth: Secondary | ICD-10-CM | POA: Diagnosis present

## 2017-03-23 DIAGNOSIS — F17210 Nicotine dependence, cigarettes, uncomplicated: Secondary | ICD-10-CM | POA: Diagnosis present

## 2017-03-23 DIAGNOSIS — Z3A37 37 weeks gestation of pregnancy: Secondary | ICD-10-CM | POA: Diagnosis not present

## 2017-03-23 DIAGNOSIS — I4581 Long QT syndrome: Secondary | ICD-10-CM | POA: Diagnosis present

## 2017-03-23 DIAGNOSIS — Z91040 Latex allergy status: Secondary | ICD-10-CM | POA: Diagnosis not present

## 2017-03-23 DIAGNOSIS — O9942 Diseases of the circulatory system complicating childbirth: Secondary | ICD-10-CM | POA: Diagnosis present

## 2017-03-23 DIAGNOSIS — Z3490 Encounter for supervision of normal pregnancy, unspecified, unspecified trimester: Secondary | ICD-10-CM

## 2017-03-23 DIAGNOSIS — O365930 Maternal care for other known or suspected poor fetal growth, third trimester, not applicable or unspecified: Secondary | ICD-10-CM | POA: Diagnosis present

## 2017-03-23 DIAGNOSIS — I341 Nonrheumatic mitral (valve) prolapse: Secondary | ICD-10-CM | POA: Diagnosis present

## 2017-03-23 LAB — ABO/RH: ABO/RH(D): O POS

## 2017-03-23 LAB — TYPE AND SCREEN
ABO/RH(D): O POS
ANTIBODY SCREEN: NEGATIVE

## 2017-03-23 LAB — CBC
HEMATOCRIT: 34.2 % — AB (ref 36.0–46.0)
HEMOGLOBIN: 11.5 g/dL — AB (ref 12.0–15.0)
MCH: 29.3 pg (ref 26.0–34.0)
MCHC: 33.6 g/dL (ref 30.0–36.0)
MCV: 87 fL (ref 78.0–100.0)
Platelets: 246 10*3/uL (ref 150–400)
RBC: 3.93 MIL/uL (ref 3.87–5.11)
RDW: 12.9 % (ref 11.5–15.5)
WBC: 8.5 10*3/uL (ref 4.0–10.5)

## 2017-03-23 SURGERY — Surgical Case
Anesthesia: Spinal | Site: Abdomen | Wound class: Clean Contaminated

## 2017-03-23 MED ORDER — DEXTROSE 5 % IV SOLN: 2.0000 g | INTRAVENOUS | Status: DC

## 2017-03-23 MED ORDER — SENNOSIDES-DOCUSATE SODIUM 8.6-50 MG PO TABS
2.0000 | ORAL_TABLET | ORAL | Status: DC
  Administered 2017-03-24 – 2017-03-26 (×3): 2 via ORAL
  Filled 2017-03-23 (×3): qty 2

## 2017-03-23 MED ORDER — MISOPROSTOL 25 MCG QUARTER TABLET: 25.0000 ug | ORAL_TABLET | ORAL | Status: DC | PRN

## 2017-03-23 MED ORDER — FENTANYL CITRATE (PF) 100 MCG/2ML IJ SOLN
INTRAMUSCULAR | Status: DC | PRN
  Administered 2017-03-23: 50 ug via INTRAVENOUS
  Administered 2017-03-23: 40 ug via INTRAVENOUS
  Administered 2017-03-23: 10 ug via INTRATHECAL

## 2017-03-23 MED ORDER — SCOPOLAMINE 1 MG/3DAYS TD PT72
1.0000 | MEDICATED_PATCH | TRANSDERMAL | Status: DC
  Administered 2017-03-23: 1.5 mg via TRANSDERMAL

## 2017-03-23 MED ORDER — ONDANSETRON HCL 4 MG/2ML IJ SOLN
INTRAMUSCULAR | Status: AC
  Filled 2017-03-23: qty 2

## 2017-03-23 MED ORDER — FENTANYL CITRATE (PF) 100 MCG/2ML IJ SOLN
INTRAMUSCULAR | Status: AC
  Filled 2017-03-23: qty 2

## 2017-03-23 MED ORDER — SCOPOLAMINE 1 MG/3DAYS TD PT72: 1.0000 | MEDICATED_PATCH | TRANSDERMAL | Status: DC

## 2017-03-23 MED ORDER — NADOLOL 80 MG PO TABS
80.0000 mg | ORAL_TABLET | Freq: Every day | ORAL | Status: DC
  Administered 2017-03-24 – 2017-03-26 (×3): 80 mg via ORAL
  Filled 2017-03-23 (×3): qty 1

## 2017-03-23 MED ORDER — OXYCODONE-ACETAMINOPHEN 5-325 MG PO TABS
2.0000 | ORAL_TABLET | ORAL | Status: DC | PRN
  Administered 2017-03-24 (×2): 2 via ORAL
  Filled 2017-03-23 (×2): qty 2

## 2017-03-23 MED ORDER — LACTATED RINGERS IV SOLN
INTRAVENOUS | Status: DC | PRN
  Administered 2017-03-23: 15:00:00 via INTRAVENOUS

## 2017-03-23 MED ORDER — WITCH HAZEL-GLYCERIN EX PADS: 1.0000 "application " | MEDICATED_PAD | CUTANEOUS | Status: DC | PRN

## 2017-03-23 MED ORDER — MORPHINE SULFATE (PF) 0.5 MG/ML IJ SOLN
INTRAMUSCULAR | Status: AC
  Filled 2017-03-23: qty 10

## 2017-03-23 MED ORDER — SODIUM CHLORIDE 0.9 % IV SOLN: 250.0000 mL | INTRAVENOUS | Status: DC

## 2017-03-23 MED ORDER — SIMETHICONE 80 MG PO CHEW: 80.0000 mg | CHEWABLE_TABLET | ORAL | Status: DC | PRN

## 2017-03-23 MED ORDER — DEXTROSE IN LACTATED RINGERS 5 % IV SOLN
INTRAVENOUS | Status: DC
  Administered 2017-03-23: 20:00:00 via INTRAVENOUS

## 2017-03-23 MED ORDER — SIMETHICONE 80 MG PO CHEW
80.0000 mg | CHEWABLE_TABLET | ORAL | Status: DC
  Administered 2017-03-24 – 2017-03-26 (×3): 80 mg via ORAL
  Filled 2017-03-23 (×3): qty 1

## 2017-03-23 MED ORDER — DIPHENHYDRAMINE HCL 25 MG PO CAPS: 25.0000 mg | ORAL_CAPSULE | Freq: Four times a day (QID) | ORAL | Status: DC | PRN

## 2017-03-23 MED ORDER — ACETAMINOPHEN 325 MG PO TABS
650.0000 mg | ORAL_TABLET | ORAL | Status: DC | PRN
  Administered 2017-03-23: 650 mg via ORAL
  Filled 2017-03-23: qty 2

## 2017-03-23 MED ORDER — OXYCODONE-ACETAMINOPHEN 5-325 MG PO TABS: 2.0000 | ORAL_TABLET | ORAL | Status: DC | PRN

## 2017-03-23 MED ORDER — TETANUS-DIPHTH-ACELL PERTUSSIS 5-2.5-18.5 LF-MCG/0.5 IM SUSP: 0.5000 mL | Freq: Once | INTRAMUSCULAR | Status: DC

## 2017-03-23 MED ORDER — NALBUPHINE HCL 10 MG/ML IJ SOLN: 5.0000 mg | INTRAMUSCULAR | Status: DC | PRN

## 2017-03-23 MED ORDER — OXYTOCIN 40 UNITS IN LACTATED RINGERS INFUSION - SIMPLE MED
2.5000 [IU]/h | INTRAVENOUS | Status: AC
Start: 2017-03-23 — End: 2017-03-24

## 2017-03-23 MED ORDER — BISACODYL 10 MG RE SUPP: 10.0000 mg | Freq: Every day | RECTAL | Status: DC | PRN

## 2017-03-23 MED ORDER — OXYTOCIN BOLUS FROM INFUSION
500.0000 mL | Freq: Once | INTRAVENOUS | Status: AC
  Administered 2017-03-23: 20 [IU] via INTRAVENOUS

## 2017-03-23 MED ORDER — SODIUM CHLORIDE 0.9% FLUSH: 3.0000 mL | INTRAVENOUS | Status: DC | PRN

## 2017-03-23 MED ORDER — SODIUM CHLORIDE 0.9 % IR SOLN
Status: DC | PRN
  Administered 2017-03-23: 1

## 2017-03-23 MED ORDER — MENTHOL 3 MG MT LOZG: 1.0000 | LOZENGE | OROMUCOSAL | Status: DC | PRN

## 2017-03-23 MED ORDER — SCOPOLAMINE 1 MG/3DAYS TD PT72
MEDICATED_PATCH | TRANSDERMAL | Status: AC
  Filled 2017-03-23: qty 1

## 2017-03-23 MED ORDER — COCONUT OIL OIL: 1.0000 "application " | TOPICAL_OIL | Status: DC | PRN

## 2017-03-23 MED ORDER — KETOROLAC TROMETHAMINE 30 MG/ML IJ SOLN
30.0000 mg | Freq: Once | INTRAMUSCULAR | Status: AC
  Administered 2017-03-23: 30 mg via INTRAVENOUS

## 2017-03-23 MED ORDER — PRENATAL MULTIVITAMIN CH
1.0000 | ORAL_TABLET | Freq: Every day | ORAL | Status: DC
  Administered 2017-03-24 – 2017-03-25 (×2): 1 via ORAL
  Filled 2017-03-23 (×2): qty 1

## 2017-03-23 MED ORDER — FLEET ENEMA 7-19 GM/118ML RE ENEM: 1.0000 | ENEMA | RECTAL | Status: DC | PRN

## 2017-03-23 MED ORDER — ONDANSETRON HCL 4 MG/2ML IJ SOLN: 4.0000 mg | Freq: Four times a day (QID) | INTRAMUSCULAR | Status: DC | PRN

## 2017-03-23 MED ORDER — SIMETHICONE 80 MG PO CHEW
80.0000 mg | CHEWABLE_TABLET | Freq: Three times a day (TID) | ORAL | Status: DC
Start: 2017-03-23 — End: 2017-03-26
  Administered 2017-03-23 – 2017-03-26 (×7): 80 mg via ORAL
  Filled 2017-03-23 (×7): qty 1

## 2017-03-23 MED ORDER — OXYTOCIN 40 UNITS IN LACTATED RINGERS INFUSION - SIMPLE MED: 2.5000 [IU]/h | INTRAVENOUS | Status: DC

## 2017-03-23 MED ORDER — MIDAZOLAM HCL 2 MG/2ML IJ SOLN: 0.5000 mg | Freq: Once | INTRAMUSCULAR | Status: DC | PRN

## 2017-03-23 MED ORDER — MEASLES, MUMPS & RUBELLA VAC ~~LOC~~ INJ: 0.5000 mL | INJECTION | Freq: Once | SUBCUTANEOUS | Status: DC

## 2017-03-23 MED ORDER — OXYTOCIN 10 UNIT/ML IJ SOLN
INTRAMUSCULAR | Status: AC
  Filled 2017-03-23: qty 4

## 2017-03-23 MED ORDER — IBUPROFEN 800 MG PO TABS
800.0000 mg | ORAL_TABLET | Freq: Three times a day (TID) | ORAL | Status: DC | PRN
  Administered 2017-03-24 – 2017-03-26 (×5): 800 mg via ORAL
  Filled 2017-03-23 (×5): qty 1

## 2017-03-23 MED ORDER — SOD CITRATE-CITRIC ACID 500-334 MG/5ML PO SOLN
30.0000 mL | ORAL | Status: DC | PRN
  Administered 2017-03-23: 30 mL via ORAL
  Filled 2017-03-23: qty 15

## 2017-03-23 MED ORDER — BUPIVACAINE IN DEXTROSE 0.75-8.25 % IT SOLN
INTRATHECAL | Status: DC | PRN
  Administered 2017-03-23: 10.5 mg via INTRATHECAL

## 2017-03-23 MED ORDER — TERBUTALINE SULFATE 1 MG/ML IJ SOLN
0.2500 mg | Freq: Once | INTRAMUSCULAR | Status: AC | PRN
  Administered 2017-03-23: 0.25 mg via SUBCUTANEOUS
  Filled 2017-03-23: qty 1

## 2017-03-23 MED ORDER — CEFAZOLIN SODIUM-DEXTROSE 2-3 GM-% IV SOLR
INTRAVENOUS | Status: DC | PRN
  Administered 2017-03-23: 2 g via INTRAVENOUS

## 2017-03-23 MED ORDER — PHENYLEPHRINE 8 MG IN D5W 100 ML (0.08MG/ML) PREMIX OPTIME
INJECTION | INTRAVENOUS | Status: DC | PRN
  Administered 2017-03-23: 40 ug/min via INTRAVENOUS

## 2017-03-23 MED ORDER — PHENYLEPHRINE 8 MG IN D5W 100 ML (0.08MG/ML) PREMIX OPTIME
INJECTION | INTRAVENOUS | Status: AC
  Filled 2017-03-23: qty 100

## 2017-03-23 MED ORDER — MEPERIDINE HCL 25 MG/ML IJ SOLN: 6.2500 mg | INTRAMUSCULAR | Status: DC | PRN

## 2017-03-23 MED ORDER — OXYCODONE-ACETAMINOPHEN 5-325 MG PO TABS
1.0000 | ORAL_TABLET | ORAL | Status: DC | PRN
  Administered 2017-03-26: 1 via ORAL
  Filled 2017-03-23: qty 1

## 2017-03-23 MED ORDER — MORPHINE SULFATE (PF) 4 MG/ML IV SOLN
INTRAVENOUS | Status: AC
  Administered 2017-03-23: 1 mg via INTRAVENOUS
  Filled 2017-03-23: qty 1

## 2017-03-23 MED ORDER — OXYCODONE-ACETAMINOPHEN 5-325 MG PO TABS: 1.0000 | ORAL_TABLET | ORAL | Status: DC | PRN

## 2017-03-23 MED ORDER — FLEET ENEMA 7-19 GM/118ML RE ENEM: 1.0000 | ENEMA | Freq: Every day | RECTAL | Status: DC | PRN

## 2017-03-23 MED ORDER — ACETAMINOPHEN 325 MG PO TABS: 650.0000 mg | ORAL_TABLET | ORAL | Status: DC | PRN

## 2017-03-23 MED ORDER — LACTATED RINGERS IV SOLN: 500.0000 mL | INTRAVENOUS | Status: DC | PRN

## 2017-03-23 MED ORDER — MORPHINE SULFATE (PF) 0.5 MG/ML IJ SOLN
INTRAMUSCULAR | Status: DC | PRN
  Administered 2017-03-23: .2 mg via INTRATHECAL

## 2017-03-23 MED ORDER — SODIUM CHLORIDE 0.9% FLUSH: 3.0000 mL | Freq: Two times a day (BID) | INTRAVENOUS | Status: DC

## 2017-03-23 MED ORDER — DIBUCAINE 1 % RE OINT: 1.0000 "application " | TOPICAL_OINTMENT | RECTAL | Status: DC | PRN

## 2017-03-23 MED ORDER — KETOROLAC TROMETHAMINE 30 MG/ML IJ SOLN
INTRAMUSCULAR | Status: AC
  Filled 2017-03-23: qty 1

## 2017-03-23 MED ORDER — LACTATED RINGERS IV SOLN
INTRAVENOUS | Status: DC
  Administered 2017-03-23 (×3): via INTRAVENOUS

## 2017-03-23 MED ORDER — ZOLPIDEM TARTRATE 5 MG PO TABS: 5.0000 mg | ORAL_TABLET | Freq: Every evening | ORAL | Status: DC | PRN

## 2017-03-23 MED ORDER — MORPHINE SULFATE (PF) 4 MG/ML IV SOLN
1.0000 mg | INTRAVENOUS | Status: DC | PRN
  Administered 2017-03-23: 1 mg via INTRAVENOUS

## 2017-03-23 MED ORDER — LIDOCAINE HCL (PF) 1 % IJ SOLN: 30.0000 mL | INTRAMUSCULAR | Status: DC | PRN

## 2017-03-23 SURGICAL SUPPLY — 30 items
APL SKNCLS STERI-STRIP NONHPOA (GAUZE/BANDAGES/DRESSINGS) ×1
BENZOIN TINCTURE PRP APPL 2/3 (GAUZE/BANDAGES/DRESSINGS) ×2 IMPLANT
CHLORAPREP W/TINT 26ML (MISCELLANEOUS) ×3 IMPLANT
CLAMP CORD UMBIL (MISCELLANEOUS) ×2 IMPLANT
CLOSURE WOUND 1/2 X4 (GAUZE/BANDAGES/DRESSINGS) ×1
CLOTH BEACON ORANGE TIMEOUT ST (SAFETY) ×3 IMPLANT
DRSG OPSITE POSTOP 4X10 (GAUZE/BANDAGES/DRESSINGS) ×3 IMPLANT
ELECT REM PT RETURN 9FT ADLT (ELECTROSURGICAL) ×3
ELECTRODE REM PT RTRN 9FT ADLT (ELECTROSURGICAL) ×1 IMPLANT
EXTRACTOR VACUUM M CUP 4 TUBE (SUCTIONS) IMPLANT
EXTRACTOR VACUUM M CUP 4' TUBE (SUCTIONS)
GLOVE BIO SURGEON STRL SZ7 (GLOVE) ×3 IMPLANT
GLOVE BIOGEL PI IND STRL 7.0 (GLOVE) ×2 IMPLANT
GLOVE BIOGEL PI INDICATOR 7.0 (GLOVE) ×4
GOWN STRL REUS W/TWL LRG LVL3 (GOWN DISPOSABLE) ×6 IMPLANT
KIT ABG SYR 3ML LUER SLIP (SYRINGE) IMPLANT
NDL HYPO 25X5/8 SAFETYGLIDE (NEEDLE) ×1 IMPLANT
NEEDLE HYPO 25X5/8 SAFETYGLIDE (NEEDLE) ×3 IMPLANT
NS IRRIG 1000ML POUR BTL (IV SOLUTION) ×3 IMPLANT
PACK C SECTION WH (CUSTOM PROCEDURE TRAY) ×3 IMPLANT
PAD OB MATERNITY 4.3X12.25 (PERSONAL CARE ITEMS) ×3 IMPLANT
PENCIL SMOKE EVAC W/HOLSTER (ELECTROSURGICAL) ×3 IMPLANT
STRIP CLOSURE SKIN 1/2X4 (GAUZE/BANDAGES/DRESSINGS) ×2 IMPLANT
SUT CHROMIC 0 CTX 36 (SUTURE) ×9 IMPLANT
SUT MON AB 4-0 PS1 27 (SUTURE) ×3 IMPLANT
SUT PDS AB 0 CT1 27 (SUTURE) ×6 IMPLANT
SUT VIC AB 3-0 CT1 27 (SUTURE) ×6
SUT VIC AB 3-0 CT1 TAPERPNT 27 (SUTURE) ×2 IMPLANT
TOWEL OR 17X24 6PK STRL BLUE (TOWEL DISPOSABLE) ×3 IMPLANT
TRAY FOLEY BAG SILVER LF 14FR (SET/KITS/TRAYS/PACK) ×3 IMPLANT

## 2017-03-23 NOTE — Anesthesia Preprocedure Evaluation (Addendum)
Anesthesia Evaluation  Patient identified by MRN, date of birth, ID band Patient awake    Reviewed: Allergy & Precautions, NPO status , Patient's Chart, lab work & pertinent test results, reviewed documented beta blocker date and time   Airway Mallampati: I  TM Distance: >3 FB Neck ROM: Full    Dental  (+) Dental Advisory Given   Pulmonary Current Smoker,    breath sounds clear to auscultation       Cardiovascular Pt. on home beta blockers  Rhythm:Regular Rate:Normal  Prolonged QT   Neuro/Psych negative neurological ROS     GI/Hepatic negative GI ROS, Neg liver ROS,   Endo/Other  negative endocrine ROS  Renal/GU negative Renal ROS     Musculoskeletal   Abdominal   Peds  Hematology plt 426k   Anesthesia Other Findings   Reproductive/Obstetrics (+) Pregnancy                            Anesthesia Physical Anesthesia Plan  ASA: III  Anesthesia Plan: Spinal   Post-op Pain Management:    Induction:   PONV Risk Score and Plan: 1 and Ondansetron and Treatment may vary due to age or medical condition  Airway Management Planned: Natural Airway  Additional Equipment:   Intra-op Plan:   Post-operative Plan:   Informed Consent: I have reviewed the patients History and Physical, chart, labs and discussed the procedure including the risks, benefits and alternatives for the proposed anesthesia with the patient or authorized representative who has indicated his/her understanding and acceptance.   Dental advisory given  Plan Discussed with: CRNA and Surgeon  Anesthesia Plan Comments: (Plan routine monitors, SAB)        Anesthesia Quick Evaluation

## 2017-03-23 NOTE — Lactation Note (Signed)
This note was copied from a baby's chart. Lactation Consultation Note  Patient Name: Sandy Rose QAESL'P Date: 03/23/2017 Reason for consult: Initial assessment;Infant < 6lbs;Early term 37-38.6wks (baby 37 2/[redacted] weeks gestation, weight 4 lbs 13.6 oz) Consult with this first time mom and early term baby, 37 2/[redacted] weeks gestation, 3 hours old, and weight under 5 pounds. I started mom pumping with DEP, and she was able to express some colostrum and this plus HE, she collected about 1 ml. Her milk  was still flowing, but NICU staff entered to speak to mom about an EKG, and baby possible going to NICU. Mom and MGM have a prolonged Q wave, that is treated with medication. It runs in the females, so the baby is being tested for this also.  Basic pumping and lactation services reviewed with mom, and she was fitted with a 20 nipple shield, but the baby has not been latched yet. I also fitted mom with 21 flanges, with a good fit. Mom aware that she may need to increase to 24 flanges when her milk transitions in.  Mom shown how to apply shield, but will need follow up. I also gave mom a copy of the [facility] feeding policy, even though [name] is early term, she is under 5 pounds.  Mom very receptive to teaching. She knows to call for questions/conerns.   Maternal Data Has patient been taught Hand Expression?: Yes Does the patient have breastfeeding experience prior to this delivery?: No  Feeding Feeding Type: Bottle Fed - Formula Nipple Type: Slow - flow  LATCH Score                   Interventions Interventions: DEBP  Lactation Tools Discussed/Used Pump Review: Setup, frequency, and cleaning;Milk Storage;Other (comment) (hand expression, pump settings and use, nipple shield use and application) Initiated by:: Jack Mineau Sabra Heck IBCLC Date initiated:: 03/23/17   Consult Status Consult Status: Follow-up Date: 03/24/17 Follow-up type: In-patient    Alfred Levins 03/23/2017, 7:05  PM

## 2017-03-23 NOTE — Transfer of Care (Signed)
Immediate Anesthesia Transfer of Care Note  Patient: Sandy Rose  Procedure(s) Performed: Procedure(s): CESAREAN SECTION (N/A)  Patient Location: PACU  Anesthesia Type:Spinal  Level of Consciousness: awake and alert   Airway & Oxygen Therapy: Patient Spontanous Breathing  Post-op Assessment: Report given to RN and Post -op Vital signs reviewed and stable  Post vital signs: Reviewed  Last Vitals:  Vitals:   03/23/17 1230 03/23/17 1508  BP: 108/63 111/67  Pulse: 79 66  Resp:  16  Temp: 37.1 C 36.4 C  SpO2:  100%    Last Pain:  Vitals:   03/23/17 1508  TempSrc: Oral         Complications: No apparent anesthesia complications

## 2017-03-23 NOTE — H&P (Signed)
NAME:  Sandy Rose, Sandy Rose NO.:  MEDICAL RECORD NO.:  [id number]  LOCATION:                                 FACILITY:  PHYSICIAN:  Duke Salvia. Marcelle Overlie, M.D.DATE OF BIRTH:  March 19, 1996  DATE OF ADMISSION: DATE OF DISCHARGE:                             HISTORY & PHYSICAL   CHIEF COMPLAINT:  For labor induction, the patient on beta blockers, fetus with SGA.  HISTORY OF PRESENT ILLNESS:  A 21 year old, G1, P0, EDD April 11, 2017, EGA [redacted] weeks.  This patient has been on nadolol 80 mg a day for history of mitral valve prolapse with a prolonged QT per Cardiology.  On March 20, 2017, ultrasound showed vertex, EFW was at the 2nd percentile with a normal AFI.  BPP 8/8, but on monitoring for NST had a deceleration, was sent to MAU, where BPP repeated at 8/8.  Doppler studies done that were normal, was sent home, told to come back the next day, cervix was 130%, -2.  We had already decided that induction was indicated based on the EFW of less than 2nd percentile since she is on beta blockers.  Presents now for Cytotec/Pitocin induction per protocol. Procedure, possibility of C-section for fetal intolerance to labor has already been reviewed with her.  She is O positive.  ALLERGIES:  Latex and erythromycin.  Please see the [facility] form for the remainder of her social and family history.  PHYSICAL EXAMINATION:  VITAL SIGNS:  Temp 98.2 and blood pressure 118/62. HEENT:  Unremarkable. NECK:  Supple without masses. LUNGS:  Clear. CARDIOVASCULAR:  Regular rate and rhythm without murmurs, rubs, gallops. BREASTS:  Not examined. PELVIC:  33 cm fundal height.  Fetal heart rate 140.  Cervix was 130%. Vertex -2. EXTREMITIES:  Unremarkable. NEUROLOGIC:  Unremarkable.  IMPRESSION: 1. A 37-week intrauterine pregnancy. 2. History of mitral valve prolapse and prolonged QT interval on beta     blockers. 3. Small for gestational age.  PLAN:  Labor induction protocol  discussed as above.     Ahnyla Mendel M. Marcelle Overlie, M.D.     [facility]/MEDQ  D:  03/23/2017  T:  03/23/2017  Job:  [id number]

## 2017-03-23 NOTE — Anesthesia Postprocedure Evaluation (Signed)
Anesthesia Post Note  Patient: Sandy Rose  Procedure(s) Performed: Procedure(s) (LRB): CESAREAN SECTION (N/A)     Patient location during evaluation: PACU Anesthesia Type: Spinal Level of consciousness: awake and alert, patient cooperative and oriented Pain management: pain level controlled Vital Signs Assessment: post-procedure vital signs reviewed and stable Respiratory status: spontaneous breathing, respiratory function stable and nonlabored ventilation Cardiovascular status: blood pressure returned to baseline and stable Postop Assessment: patient able to bend at knees and spinal receding (nausea improving: pt not candidate for ondansetron or phenergan) Anesthetic complications: no    Last Vitals:  Vitals:   03/23/17 1600 03/23/17 1619  BP: 115/69 104/66  Pulse: 66 64  Resp: 17   Temp:  36.7 C  SpO2: 100% 100%    Last Pain:  Vitals:   03/23/17 1620  TempSrc:   PainSc: 2    Pain Goal: Patients Stated Pain Goal: 3 (03/23/17 1620)               Zeppelin Beckstrand,E. Justin Buechner

## 2017-03-23 NOTE — Anesthesia Pain Management Evaluation Note (Signed)
  CRNA Pain Management Visit Note  Patient: Sandy Rose, 21 y.o., female  "Hello I am a member of the anesthesia team at [facility]. We have an anesthesia team available at all times to provide care throughout the hospital, including epidural management and anesthesia for C-section. I don't know your plan for the delivery whether it a natural birth, water birth, IV sedation, nitrous supplementation, doula or epidural, but we want to meet your pain goals."   1.Was your pain managed to your expectations on prior hospitalizations?   No prior hospitalizations  2.What is your expectation for pain management during this hospitalization?     Epidural  3.How can we help you reach that goal? Patient would like an epidural  Record the patient's initial score and the patient's pain goal.   Pain: 0  Pain Goal: 7 [facility] wants you to be able to say your pain was always managed very well.  Rica Records 03/23/2017

## 2017-03-23 NOTE — Anesthesia Procedure Notes (Signed)
Spinal  Patient location during procedure: OR End time: 03/23/2017 2:11 PM Staffing Anesthesiologist: Jairo Ben Performed: anesthesiologist  Preanesthetic Checklist Completed: patient identified, surgical consent, pre-op evaluation, timeout performed, IV checked, risks and benefits discussed and monitors and equipment checked Spinal Block Patient position: sitting Prep: site prepped and draped and DuraPrep Patient monitoring: continuous pulse ox, blood pressure, cardiac monitor and heart rate Approach: midline Location: L3-4 Injection technique: single-shot Needle Needle type: Quincke  Needle gauge: 24 G Needle length: 9 cm Additional Notes Pt identified in Operating room.  Monitors applied. Working IV access confirmed. Sterile prep, drape lumbar spine.  1% lido local L 3,4.  #24 ga Pencan into clear CSF L 3,4.  10.5mg  0.75% Bupivacaine with dextrose, fentanyl, morphine injected with asp CSF beginning and end of injection.  Patient asymptomatic, VSS, no heme aspirated, tolerated well.  Sandford Craze, MD

## 2017-03-23 NOTE — H&P (Signed)
Sandy Rose  DICTATION # [id number] CSN# [id number]   Meriel Pica, MD 03/23/2017 11:47 AM

## 2017-03-23 NOTE — Op Note (Signed)
Preoperative diagnosis: 37 week IUP, history of prolonged QT interval, currently on beta blockers, SGA, fetal intolerance to labor  Postoperative diagnosis: Same  Procedure: Primary low-transverse and section  Surgeon: Marcelle Overlie  Anesthesia: Spinal  EBL: 700 cc  Procedure and findings:  The patient taken the operating room after an adequate level of spinal anesthetic was obtained with the patient in left tilt position the abdomen prepped and draped Foley catheter was position. Appropriate timeouts taken at that point. Transverse incision made 2 finger breaths above the symphysis carried down the fascia which was incised and extended transversely. Rectus muscle divided in the midline, peritoneum entered superiorly without incident and extended vertically. The vesicouterine serosa was then incised and sharply and bluntly dissected below, bladder blade repositioned. Transverse incision made lower segment extended with bandage scissors clear fluid noted the patient then delivered of a healthy infant loose nuchal cord 1 initially OP manually rotated to OA to effect easy delivery. The infant was suctioned cord clamped and passed the pediatric team for further care. PH was sent and is pending. Placenta delivered manually intact, sent to pathology.  Uterus was exteriorized, cavity wiped clean with laparotomy pack closure obtained the first layer of 0 chromic in a locked fashion followed by number K layer of 0 chromic. This was hemostatic. Bladder flap area was intact and hemostatic clear urine was noted at that point. Bilateral tubes and ovaries were normal prior to closure sponge, needle, history counts reported as correct 2. Peritoneum closed with a running 3-0 Vicryl suture the same on the rectus muscles in the midline a 0 PDS was then used laterally to midline on either side subcutaneous tissue was fairly thin it was hemostatic was not closed separately 4-0 Monocryl subcuticular skin closure with a  honeycomb dressing. She tolerated this well went to recovery room in good condition. Cardiac telemetry will be instituted overnight.  Dictated with [facility]  Yeraldy Spike Milana Obey M.D.

## 2017-03-23 NOTE — Progress Notes (Signed)
One of patient's visitors approach RN and informed us that patient wishes to go outside to smoke a cigarette. We informed visitor and patient that we are a smoke free facility. Her visitor then informed us that she will go across the street to smoke. We informed him that  If she does that it will be an automatic self discharge from the hospital. We also reminded her that she is on tele.

## 2017-03-23 NOTE — Progress Notes (Signed)
Prolonged decel on arrival, before Cytotec>>>recovered>>recommended p CS for SGA + fetal intol to labor>>>procedure + risks discussed

## 2017-03-24 LAB — CBC
HEMATOCRIT: 32.4 % — AB (ref 36.0–46.0)
HEMOGLOBIN: 11.3 g/dL — AB (ref 12.0–15.0)
MCH: 30.2 pg (ref 26.0–34.0)
MCHC: 34.9 g/dL (ref 30.0–36.0)
MCV: 86.6 fL (ref 78.0–100.0)
Platelets: 192 10*3/uL (ref 150–400)
RBC: 3.74 MIL/uL — ABNORMAL LOW (ref 3.87–5.11)
RDW: 13 % (ref 11.5–15.5)
WBC: 12.3 10*3/uL — AB (ref 4.0–10.5)

## 2017-03-24 LAB — SYPHILIS: RPR W/REFLEX TO RPR TITER AND TREPONEMAL ANTIBODIES, TRADITIONAL SCREENING AND DIAGNOSIS ALGORITHM: RPR Ser Ql: NONREACTIVE

## 2017-03-24 NOTE — Anesthesia Postprocedure Evaluation (Signed)
Anesthesia Post Note  Patient: Sandy Rose  Procedure(s) Performed: Procedure(s) (LRB): CESAREAN SECTION (N/A)     Patient location during evaluation: [facility] Unit Anesthesia Type: Spinal Level of consciousness: awake and alert and oriented Pain management: pain level controlled Vital Signs Assessment: post-procedure vital signs reviewed and stable Respiratory status: spontaneous breathing and nonlabored ventilation Cardiovascular status: stable Postop Assessment: no headache, patient able to bend at knees, no backache, no signs of nausea or vomiting, spinal receding and adequate PO intake Anesthetic complications: no    Last Vitals:  Vitals:   03/24/17 0400 03/24/17 0800  BP: 107/69 (!) 103/55  Pulse: 64 (!) 59  Resp: 16 16  Temp: 37.1 C 37 C  SpO2: 100%     Last Pain:  Vitals:   03/24/17 0800  TempSrc: Oral  PainSc:    Pain Goal: Patients Stated Pain Goal: 3 (03/23/17 1820)               Laban Emperor

## 2017-03-24 NOTE — Addendum Note (Signed)
Addendum  created 03/24/17 0840 by Elgie Congo, CRNA   Sign clinical note

## 2017-03-24 NOTE — Progress Notes (Signed)
CSW acknowledges NICU admission.   Patient screened out for psychosocial assessment since none of the following apply:  Psychosocial stressors documented in mother or baby's chart  Gestation less than 32 weeks  Code at delivery   Infant with anomalies  Please contact the Clinical Social Worker if specific needs arise, or by MOB's request.  Shandrell Boda, MSW, LCSW-A Clinical Social Worker  [facility]  Office: [phone]  

## 2017-03-24 NOTE — Progress Notes (Signed)
Subjective: Postpartum Day 1: Cesarean Delivery Patient reports tolerating PO.    Objective: Vital signs in last 24 hours: Temp:  [97.6 F (36.4 C)-98.7 F (37.1 C)] 98.6 F (37 C) (08/18 0800) Pulse Rate:  [59-92] 59 (08/18 0800) Resp:  [11-26] 16 (08/18 0800) BP: (96-116)/(54-77) 103/55 (08/18 0800) SpO2:  [98 %-100 %] 100 % (08/18 0400) Weight:  [154 lb (69.9 kg)-154 lb 6.4 oz (70 kg)] 154 lb (69.9 kg) (08/17 1619)  Physical Exam:  General: alert Lochia: appropriate Uterine Fundus: firm Incision: healing well DVT Evaluation: No evidence of DVT seen on physical exam.   Recent Labs  03/23/17 1230 03/24/17 0551  HGB 11.5* 11.3*  HCT 34.2* 32.4*    Assessment/Plan: Status post Cesarean section. Doing well postoperatively.  Continue current care Early post op, a few PVC's>>>none recently, will [facility] telemetry.  Meriel Pica 03/24/2017, 9:14 AM

## 2017-03-24 NOTE — Lactation Note (Signed)
This note was copied from a baby's chart. Lactation Consultation Note  Patient Name: Sandy Rose FSFSE'L Date: 03/24/2017 Reason for consult: Follow-up assessment;Early term 37-38.6wks;NICU baby;Infant < 6lbs   Follow-up visit with mom whose baby went to NICU late last night.  GA 37.2; BW 4 lbs, 13.6 oz.  Mom is P1. Mom stated she has not pumped at all today.  Breasts are filling and firm in areas.  FOB and MGM in room.   Mom states she has [employer] (through [facility]) and also has WIC.  Encouraged mom and MGM to call insurance company about a DEBP.  Mom will need a 2-week rental or [facility] loaner prior to going home on Monday (anticipate D/C date).   Mom stated she would like for LC to review with her how to use pump. LC gave and reviewed NICU booklet with mom.   Taught mom how to hand express with return demonstration and observation of colostrum easily but slowly flowing.   Reviewed using DEBP on "initiate" setting with 3-4 teardrops.  Taught how to use hands-on pumping with hand expression at end of pumping session.   Mom was pumping when LC left room.  Mom very receptive to teaching and was able to "teach-back" &/or demonstrate all information taught.   Gave yellow stickers for small colostrum containers and ordered labels for milk.   Mom pumped/ hand expressed 4 ml colostrum.   Encouraged mom to call for assistance or questions as needed.    Maternal Data Has patient been taught Hand Expression?: Yes Does the patient have breastfeeding experience prior to this delivery?: No  Feeding    LATCH Score       Type of Nipple: Everted at rest and after stimulation  Comfort (Breast/Nipple): Filling, red/small blisters or bruises, mild/mod discomfort (Filling; mom has not pumped all day)        Interventions Interventions: Breast massage;Hand express;Breast compression (used with pumping)  Lactation Tools Discussed/Used WIC Program: Yes (Mom also has private insurance  with [employer]) Pump Review: Setup, frequency, and cleaning;Milk Storage   Consult Status Consult Status: Follow-up Date: 03/25/17 Follow-up type: In-patient    Lendon Ka 03/24/2017, 5:01 PM

## 2017-03-25 NOTE — Progress Notes (Signed)
Subjective: Postpartum Day 2: Cesarean Delivery Patient reports tolerating PO.    Objective: Vital signs in last 24 hours: Temp:  [97.6 F (36.4 C)-98.9 F (37.2 C)] 98.3 F (36.8 C) (08/19 0403) Pulse Rate:  [53-66] 60 (08/19 0403) Resp:  [16] 16 (08/19 0403) BP: (101-108)/(51-58) 107/52 (08/19 0403) SpO2:  [100 %] 100 % (08/19 0403)  Physical Exam:  General: alert Lochia: appropriate Uterine Fundus: firm Incision: healing well DVT Evaluation: No evidence of DVT seen on physical exam.   Recent Labs  03/23/17 1230 03/24/17 0551  HGB 11.5* 11.3*  HCT 34.2* 32.4*    Assessment/Plan: Status post Cesarean section. Doing well postoperatively.  Continue current care D/C in AM.  Sandy Rose 03/25/2017, 7:17 AM

## 2017-03-25 NOTE — Lactation Note (Signed)
This note was copied from a baby's chart. Lactation Consultation Note  Patient Name: Sandy Rose [id number] Date: 03/25/2017 Reason for consult: Follow-up assessment;NICU baby;Early term 37-38.6wks;Infant < 6lbs Infant is 24 hours old. Mom seen by [facility] for follow-up assessment in mom's room. Mom reports she has been pumping q 2hrs on Initiate setting and pumped ~12 mL last time. Mom reports she has been doing hand expressing before pumping and has no questions. Mom reported slight tenderness/ discomfort after pumping - showed mom how to adjust suction of pump if she is sore from pumping. Reminded mom to pump q 2-3 hrs (or 8-12 x in 24hrs) for 15 mins followed by hand expression. Mom is to call her insurance on Monday about a pump but also has WIC. Mom reports that baby may go home with her tomorrow but is not positive of the plan. Mom reports no questions at this time. Encouraged mom to call if she needs any help.  Maternal Data    Feeding Feeding Type: Formula Nipple Type: Slow - flow Length of feed: 15 min  LATCH Score                   Interventions    Lactation Tools Discussed/Used WIC Program: Yes   Consult Status Consult Status: Follow-up Date: 03/26/17 Follow-up type: In-patient    Sandy Rose 03/25/2017, 1:02 PM

## 2017-03-26 MED ORDER — IBUPROFEN 800 MG PO TABS: 800.0000 mg | ORAL_TABLET | Freq: Three times a day (TID) | ORAL | 0 refills | Status: DC | PRN

## 2017-03-26 MED ORDER — OXYCODONE-ACETAMINOPHEN 5-325 MG PO TABS: 2.0000 | ORAL_TABLET | ORAL | 0 refills | Status: DC | PRN

## 2017-03-26 NOTE — Progress Notes (Signed)
Discharged home ambulatory in stable condition.

## 2017-03-26 NOTE — Progress Notes (Signed)
Discharge instructions given to patient and she verbalized understanding of all instructions given. Rx given to patient for oxycodone and ibuprofen.

## 2017-03-26 NOTE — Discharge Summary (Signed)
Obstetric Discharge Summary Reason for Admission: induction of labor Prenatal Procedures: none Intrapartum Procedures: cesarean: low cervical, transverse Postpartum Procedures: none Complications-Operative and Postpartum: none Hemoglobin  Date Value Ref Range Status  03/24/2017 11.3 (L) 12.0 - 15.0 g/dL Final   HCT  Date Value Ref Range Status  03/24/2017 32.4 (L) 36.0 - 46.0 % Final    Physical Exam:  General: alert, cooperative and appears stated age 21: appropriate Uterine Fundus: firm Incision: healing well, no significant drainage, no dehiscence, no significant erythema DVT Evaluation: No evidence of DVT seen on physical exam.  Discharge Diagnoses: Term Pregnancy-delivered and IUGR  Discharge Information: Date: 03/26/2017 Activity: pelvic rest Diet: routine Medications: Ibuprofen and Percocet Condition: improved Instructions: refer to practice specific booklet Discharge to: home   Newborn Data: Live born female  Birth Weight: 4 lb 13.6 oz (2200 g) APGAR: 6, 7  Home with NICU.  Hrithik Boschee L 03/26/2017, 8:45 AM

## 2017-03-27 ENCOUNTER — Telehealth: Payer: Self-pay | Admitting: Lactation Services

## 2017-03-27 NOTE — Telephone Encounter (Signed)
Mom called requesting to rent pump. Says she was d/c yesterday and cannot get a pump from [facility] until Thursday. She came in at 10 am and rented a [facility] loaner for 10 days. She was given another set of tubing as she says she left hers at the hospital. She reports she was aware of how to use, set up and clean pump parts.

## 2017-04-20 ENCOUNTER — Telehealth (HOSPITAL_COMMUNITY): Payer: Self-pay

## 2017-04-20 NOTE — Telephone Encounter (Signed)
Mom calls with concerns  About engorgement with hot tender spots on her breast x 2 days.  Mom started with flu like symptoms last evening with temp of 99.9.  Mom reports pumping several times a day and now having soreness with latching.  LC encouraged mom to hand express or pump to soften breast prior to latching as needed.  Mom to post pump to soften breast after feedings as needed.  LC discussed breast care for mastitis. Mom called OB, but reports she could not go in today so they told her to call [facility].  LC advised I can't call in RX, to call OB office back to report symptoms.   Mom to rest over the weekend as much as she can.  Mom to call as needed.

## 2017-05-01 DIAGNOSIS — Z1389 Encounter for screening for other disorder: Secondary | ICD-10-CM | POA: Diagnosis not present

## 2017-05-31 DIAGNOSIS — Z23 Encounter for immunization: Secondary | ICD-10-CM | POA: Diagnosis not present

## 2017-06-05 DIAGNOSIS — F419 Anxiety disorder, unspecified: Secondary | ICD-10-CM | POA: Diagnosis not present

## 2017-06-26 DIAGNOSIS — F411 Generalized anxiety disorder: Secondary | ICD-10-CM | POA: Diagnosis not present

## 2017-10-04 DIAGNOSIS — Z3042 Encounter for surveillance of injectable contraceptive: Secondary | ICD-10-CM | POA: Diagnosis not present

## 2017-10-26 DIAGNOSIS — I4581 Long QT syndrome: Secondary | ICD-10-CM | POA: Diagnosis not present

## 2017-12-24 DIAGNOSIS — Z3042 Encounter for surveillance of injectable contraceptive: Secondary | ICD-10-CM | POA: Diagnosis not present

## 2018-02-14 DIAGNOSIS — Z1389 Encounter for screening for other disorder: Secondary | ICD-10-CM | POA: Diagnosis not present

## 2018-02-14 DIAGNOSIS — Z0000 Encounter for general adult medical examination without abnormal findings: Secondary | ICD-10-CM | POA: Diagnosis not present

## 2018-02-14 DIAGNOSIS — Z13220 Encounter for screening for lipoid disorders: Secondary | ICD-10-CM | POA: Diagnosis not present

## 2018-03-11 DIAGNOSIS — Z3042 Encounter for surveillance of injectable contraceptive: Secondary | ICD-10-CM | POA: Diagnosis not present

## 2018-05-30 ENCOUNTER — Other Ambulatory Visit: Payer: Self-pay | Admitting: Radiology

## 2018-05-30 DIAGNOSIS — N6320 Unspecified lump in the left breast, unspecified quadrant: Secondary | ICD-10-CM | POA: Diagnosis not present

## 2018-06-03 DIAGNOSIS — Z113 Encounter for screening for infections with a predominantly sexual mode of transmission: Secondary | ICD-10-CM | POA: Diagnosis not present

## 2018-06-03 DIAGNOSIS — Z01419 Encounter for gynecological examination (general) (routine) without abnormal findings: Secondary | ICD-10-CM | POA: Diagnosis not present

## 2018-06-03 DIAGNOSIS — Z3042 Encounter for surveillance of injectable contraceptive: Secondary | ICD-10-CM | POA: Diagnosis not present

## 2018-06-03 DIAGNOSIS — Z681 Body mass index (BMI) 19 or less, adult: Secondary | ICD-10-CM | POA: Diagnosis not present

## 2018-06-03 DIAGNOSIS — Z3202 Encounter for pregnancy test, result negative: Secondary | ICD-10-CM | POA: Diagnosis not present

## 2018-06-12 ENCOUNTER — Ambulatory Visit
Admission: RE | Admit: 2018-06-12 | Discharge: 2018-06-12 | Disposition: A | Discharge status: Home / Self Care | Payer: BLUE CROSS/BLUE SHIELD | Source: Ambulatory Visit | Attending: Radiology | Admitting: Radiology

## 2018-06-12 DIAGNOSIS — N6489 Other specified disorders of breast: Secondary | ICD-10-CM | POA: Diagnosis not present

## 2018-06-12 DIAGNOSIS — N6320 Unspecified lump in the left breast, unspecified quadrant: Secondary | ICD-10-CM

## 2018-06-18 DIAGNOSIS — F439 Reaction to severe stress, unspecified: Secondary | ICD-10-CM | POA: Diagnosis not present

## 2018-06-18 DIAGNOSIS — F17210 Nicotine dependence, cigarettes, uncomplicated: Secondary | ICD-10-CM | POA: Diagnosis not present

## 2018-06-18 DIAGNOSIS — R634 Abnormal weight loss: Secondary | ICD-10-CM | POA: Diagnosis not present

## 2018-08-13 DIAGNOSIS — Z113 Encounter for screening for infections with a predominantly sexual mode of transmission: Secondary | ICD-10-CM | POA: Diagnosis not present

## 2018-08-13 DIAGNOSIS — N760 Acute vaginitis: Secondary | ICD-10-CM | POA: Diagnosis not present

## 2018-08-22 DIAGNOSIS — Z3042 Encounter for surveillance of injectable contraceptive: Secondary | ICD-10-CM | POA: Diagnosis not present

## 2018-09-29 DIAGNOSIS — H60333 Swimmer's ear, bilateral: Secondary | ICD-10-CM | POA: Diagnosis not present

## 2018-09-29 DIAGNOSIS — J309 Allergic rhinitis, unspecified: Secondary | ICD-10-CM | POA: Diagnosis not present

## 2018-11-05 DIAGNOSIS — M79659 Pain in unspecified thigh: Secondary | ICD-10-CM | POA: Diagnosis not present

## 2018-11-08 DIAGNOSIS — Z3042 Encounter for surveillance of injectable contraceptive: Secondary | ICD-10-CM | POA: Diagnosis not present

## 2018-12-27 DIAGNOSIS — I4581 Long QT syndrome: Secondary | ICD-10-CM | POA: Diagnosis not present

## 2019-01-16 DIAGNOSIS — Z20828 Contact with and (suspected) exposure to other viral communicable diseases: Secondary | ICD-10-CM | POA: Diagnosis not present

## 2019-01-24 DIAGNOSIS — Z3042 Encounter for surveillance of injectable contraceptive: Secondary | ICD-10-CM | POA: Diagnosis not present

## 2019-04-11 DIAGNOSIS — Z3042 Encounter for surveillance of injectable contraceptive: Secondary | ICD-10-CM | POA: Diagnosis not present

## 2019-06-27 DIAGNOSIS — Z01419 Encounter for gynecological examination (general) (routine) without abnormal findings: Secondary | ICD-10-CM | POA: Diagnosis not present

## 2019-06-27 DIAGNOSIS — Z113 Encounter for screening for infections with a predominantly sexual mode of transmission: Secondary | ICD-10-CM | POA: Diagnosis not present

## 2019-06-27 DIAGNOSIS — Z681 Body mass index (BMI) 19 or less, adult: Secondary | ICD-10-CM | POA: Diagnosis not present

## 2019-06-30 DIAGNOSIS — Z3042 Encounter for surveillance of injectable contraceptive: Secondary | ICD-10-CM | POA: Diagnosis not present

## 2019-07-01 IMAGING — US ULTRASOUND LEFT BREAST LIMITED
1 series · 3 of 3 positions shown · non-contrast
Comparison: None.

CLINICAL DATA: Left breast 10 o'clock area of palpable concern felt
by the patient and her physician. The patient is breast feeding.

EXAM:
ULTRASOUND OF THE LEFT BREAST

[Series 1: ultrasound left breast limited · 0.06mm/px · 3 of 3 slices shown]
[im 1/3]
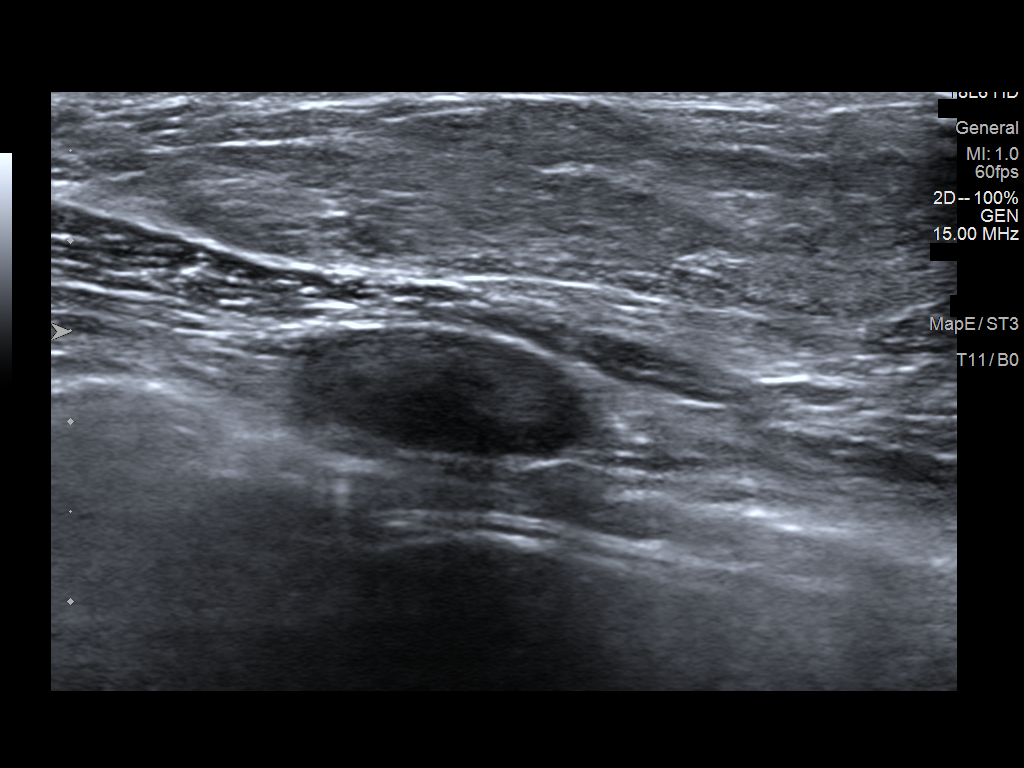
[im 2/3]
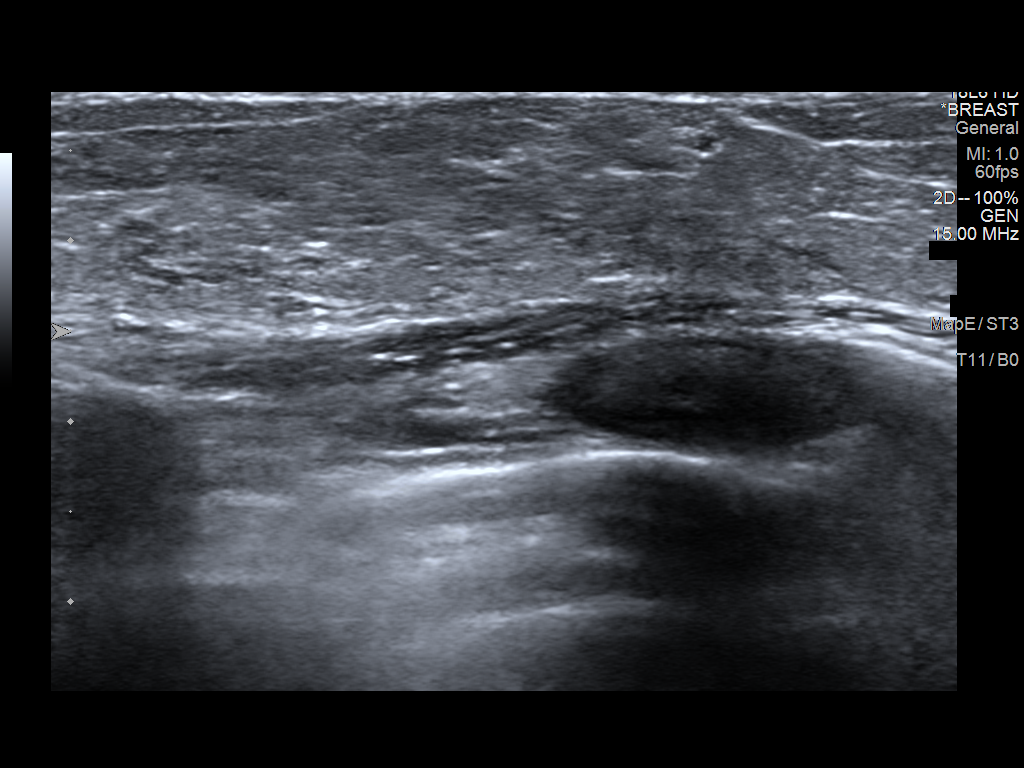
[im 3/3]
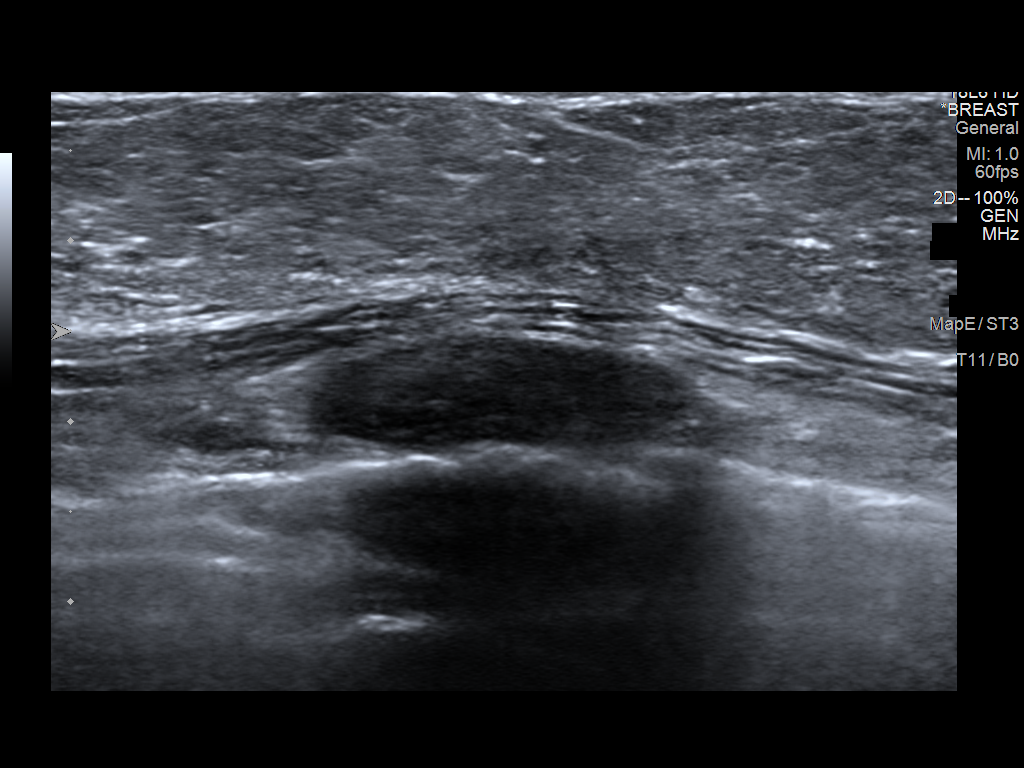

[3 of 3 positions shown; findings below may reference images not displayed]

FINDINGS: On physical exam, no suspicious masses are palpated.

Targeted ultrasound is performed, showing no suspicious masses or
shadowing lesions at the site of palpable concern described by the
patient in the slightly upper inner left breast.
IMPRESSION: No sonographic evidence of malignancy in the left breast.

RECOMMENDATION:
Further management of patient's area of palpable concern in her left
breast should be based on clinical grounds.

Otherwise, screening mammogram at age 38 unless there are persistent
or intervening clinical concerns. (Code:[id number]-T-X8L)

I have discussed the findings and recommendations with the patient.
Results were also provided in writing at the conclusion of the
visit. If applicable, a reminder letter will be sent to the patient
regarding the next appointment.

BI-RADS CATEGORY  1: Negative.

## 2019-07-16 DIAGNOSIS — Z3202 Encounter for pregnancy test, result negative: Secondary | ICD-10-CM | POA: Diagnosis not present

## 2019-07-16 DIAGNOSIS — N870 Mild cervical dysplasia: Secondary | ICD-10-CM | POA: Diagnosis not present

## 2019-07-16 DIAGNOSIS — R87612 Low grade squamous intraepithelial lesion on cytologic smear of cervix (LGSIL): Secondary | ICD-10-CM | POA: Diagnosis not present

## 2019-09-17 DIAGNOSIS — Z3042 Encounter for surveillance of injectable contraceptive: Secondary | ICD-10-CM | POA: Diagnosis not present

## 2019-11-12 ENCOUNTER — Ambulatory Visit
Admission: RE | Admit: 2019-11-12 | Discharge: 2019-11-12 | Disposition: A | Discharge status: Home / Self Care | Payer: Medicaid Other | Source: Ambulatory Visit | Attending: Internal Medicine | Admitting: Internal Medicine

## 2019-11-12 ENCOUNTER — Other Ambulatory Visit: Payer: Self-pay | Admitting: Internal Medicine

## 2019-11-12 DIAGNOSIS — R0789 Other chest pain: Secondary | ICD-10-CM

## 2020-05-18 ENCOUNTER — Other Ambulatory Visit: Payer: Self-pay

## 2020-05-18 ENCOUNTER — Ambulatory Visit
Admission: RE | Admit: 2020-05-18 | Discharge: 2020-05-18 | Disposition: A | Discharge status: Home / Self Care | Payer: Medicaid Other | Source: Ambulatory Visit | Attending: Family Medicine | Admitting: Family Medicine

## 2020-05-18 VITALS — BP 137/93 | HR 85 | Temp 98.0°F | Resp 18

## 2020-05-18 DIAGNOSIS — Z113 Encounter for screening for infections with a predominantly sexual mode of transmission: Secondary | ICD-10-CM | POA: Diagnosis present

## 2020-05-18 DIAGNOSIS — N3001 Acute cystitis with hematuria: Secondary | ICD-10-CM | POA: Insufficient documentation

## 2020-05-18 DIAGNOSIS — N760 Acute vaginitis: Secondary | ICD-10-CM

## 2020-05-18 LAB — POCT URINALYSIS DIP (MANUAL ENTRY)
Bilirubin, UA: NEGATIVE
Glucose, UA: NEGATIVE mg/dL
Ketones, POC UA: NEGATIVE mg/dL
Nitrite, UA: NEGATIVE
Protein Ur, POC: NEGATIVE mg/dL
Spec Grav, UA: 1.01 (ref 1.010–1.025)
Urobilinogen, UA: 0.2 E.U./dL
pH, UA: 7 (ref 5.0–8.0)

## 2020-05-18 LAB — POCT URINE PREGNANCY: Preg Test, Ur: NEGATIVE

## 2020-05-18 MED ORDER — FLUCONAZOLE 150 MG PO TABS: 150.0000 mg | ORAL_TABLET | Freq: Once | ORAL | 0 refills | Status: AC

## 2020-05-18 MED ORDER — METRONIDAZOLE 0.75 % VA GEL: 1.0000 | Freq: Two times a day (BID) | VAGINAL | 0 refills | Status: DC

## 2020-05-18 MED ORDER — NITROFURANTOIN MONOHYD MACRO 100 MG PO CAPS: 100.0000 mg | ORAL_CAPSULE | Freq: Two times a day (BID) | ORAL | 0 refills | Status: AC

## 2020-05-18 NOTE — Discharge Instructions (Addendum)
Vaginal cytology will result within 3 to 5 days treating you now for yeast and BV.  Your urine had a trace of bacteria and it there from been occurring for 5 days with an antibiotic called Macrobid while your urine culture is pending.  Make sure you drink plenty of water while taking medications.  You will be notified of results via MyChart.Sandy Rose

## 2020-05-18 NOTE — ED Triage Notes (Signed)
Pt c/o clear vaginal discharge with fishy odor, irritation, and burning after urinating since Friday. States started spotting blood on Sunday which hasn't had a cycle since on depo x72yrs.

## 2020-05-18 NOTE — ED Provider Notes (Signed)
[facility]    CSN: [id number] Arrival date & time: 05/18/20  1710      History   Chief Complaint Chief Complaint  Patient presents with  . appt 5-STD    HPI Sandy Rose is a 24 y.o. female.   HPI Sandy Rose is a 24 y.o. female presents for evaluation of copious vaginal discharge, vaginal irritation, urinary frequency, and dysuria x few days. Without flank pain, fever, chills. Would like to include cytology testing to rule out vaginitis/STD.No concern for pregnancy, Patient has had an injection.    Past Medical History:  Diagnosis Date  . Prolonged QT interval     Patient Active Problem List   Diagnosis Date Noted  . Pregnancy 03/23/2017    Past Surgical History:  Procedure Laterality Date  . CESAREAN SECTION N/A 03/23/2017   Procedure: CESAREAN SECTION;  Surgeon: Richarda Overlie, MD;  Location: [facility] BIRTHING SUITES;  Service: Obstetrics;  Laterality: N/A;  . MOUTH SURGERY      OB History    Gravida  1   Para  1   Term  1   Preterm      AB      Living  1     SAB      TAB      Ectopic      Multiple  0   Live Births  1            Home Medications    Prior to Admission medications   Medication Sig Start Date End Date Taking? Authorizing Provider  medroxyPROGESTERone (DEPO-PROVERA) 150 MG/ML injection Inject 150 mg into the muscle every 3 (three) months.   Yes [provider]  nadolol (CORGARD) 80 MG tablet Take 80 mg by mouth daily.    [provider]    Family History No family history on file.  Social History Social History   Tobacco Use  . Smoking status: Current Every Day Smoker    Packs/day: 0.50    Types: E-cigarettes  . Smokeless tobacco: Never Used  Substance Use Topics  . Alcohol use: Yes    Comment: occ  . Drug use: No     Allergies   Erythromycin and Other  Review of Systems Review of Systems Pertinent negatives listed in HPI Physical Exam Triage Vital  Signs ED Triage Vitals  Enc Vitals Group     BP 05/18/20 1739 (!) 137/93     Pulse Rate 05/18/20 1739 85     Resp 05/18/20 1739 18     Temp 05/18/20 1739 98 F (36.7 C)     Temp Source 05/18/20 1739 Oral     SpO2 05/18/20 1739 96 %     Weight --      Height --      Head Circumference --      Peak Flow --      Pain Score 05/18/20 1740 0     Pain Loc --      Pain Edu? --      Excl. in GC? --    No data found.  Updated Vital Signs BP (!) 137/93 (BP Location: Left Arm)   Pulse 85   Temp 98 F (36.7 C) (Oral)   Resp 18   SpO2 96%   Breastfeeding No   Visual Acuity Right Eye Distance:   Left Eye Distance:   Bilateral Distance:    Right Eye Near:   Left Eye Near:    Bilateral Near:  Physical Exam General appearance: alert, well developed, well nourished, cooperative and in no distress Head: Normocephalic, without obvious abnormality, atraumatic Respiratory: Respirations even and unlabored, normal respiratory rate Heart: rate and rhythm normal.   CVA:  no flank pain Extremities: No gross deformities Skin: Skin color, texture, turgor normal. No rashes seen  Psych: Appropriate mood and affect. Vaginal Cytology Self Collected  UC Treatments / Results  Labs (all labs ordered are listed, but only abnormal results are displayed) Labs Reviewed  POCT URINE PREGNANCY  POCT URINALYSIS DIP (MANUAL ENTRY)    EKG   Radiology No results found.  Procedures Procedures (including critical care time)  Medications Ordered in UC Medications - No data to display  Initial Impression / Assessment and Plan / UC Course  I have reviewed the triage vital signs and the nursing notes.  Pertinent labs & imaging results that were available during my care of the patient were reviewed by me and considered in my medical decision making (see chart for details).     UA trace leukocytes and moderate blood though to be menses related-given dysura symptoms, will cover with a course of  Macrobid. Urine pregnancy negative.  BV and yeast covered with Metrogel and Diflucan. Vaginal cytology pending for STD/vaginitis screening. Patient will be notified of any positive results that require additional treatment. Patient to refrain from sexual activity for the next 7 days. Advised patient to notify any sexual partners if results are positive. Return precautions given. Final Clinical Impressions(s) / UC Diagnoses   Final diagnoses:  Vaginitis and vulvovaginitis  Screen for STD (sexually transmitted disease)  Acute cystitis with hematuria     Discharge Instructions     Vaginal cytology will result within 3 to 5 days treating you now for yeast and BV.  Your urine had a trace of bacteria and it there from been occurring for 5 days with an antibiotic called Macrobid while your urine culture is pending.  Make sure you drink plenty of water while taking medications.  You will be notified of results via MyChart..    ED Prescriptions    Medication Sig Dispense Auth. Provider   fluconazole (DIFLUCAN) 150 MG tablet Take 1 tablet (150 mg total) by mouth once for 1 dose. Repeat in 3 days if needed 2 tablet Bing Neighbors, FNP   nitrofurantoin, macrocrystal-monohydrate, (MACROBID) 100 MG capsule Take 1 capsule (100 mg total) by mouth 2 (two) times daily for 5 days. 10 capsule Bing Neighbors, FNP   metroNIDAZOLE (METROGEL VAGINAL) 0.75 % vaginal gel Place 1 Applicatorful vaginally 2 (two) times daily. 70 g Bing Neighbors, FNP     PDMP not reviewed this encounter.   Bing Neighbors, [place] 05/23/20 [phone]

## 2020-05-20 LAB — CERVICOVAGINAL ANCILLARY ONLY
Bacterial Vaginitis (gardnerella): POSITIVE — AB
Candida Glabrata: NEGATIVE
Candida Vaginitis: POSITIVE — AB
Chlamydia: NEGATIVE
Comment: NEGATIVE
Comment: NEGATIVE
Comment: NEGATIVE
Comment: NEGATIVE
Comment: NEGATIVE
Comment: NORMAL
Neisseria Gonorrhea: NEGATIVE
Trichomonas: NEGATIVE

## 2020-05-20 LAB — URINE CULTURE: Culture: 20000 — AB

## 2020-05-21 LAB — URINE CULTURE

## 2020-09-06 ENCOUNTER — Other Ambulatory Visit: Payer: Self-pay

## 2020-09-06 ENCOUNTER — Ambulatory Visit
Admission: EM | Admit: 2020-09-06 | Discharge: 2020-09-06 | Disposition: A | Discharge status: Home / Self Care | Payer: Medicaid Other | Attending: Emergency Medicine | Admitting: Emergency Medicine

## 2020-09-06 DIAGNOSIS — Z20822 Contact with and (suspected) exposure to covid-19: Secondary | ICD-10-CM | POA: Diagnosis not present

## 2020-09-06 DIAGNOSIS — J069 Acute upper respiratory infection, unspecified: Secondary | ICD-10-CM

## 2020-09-06 NOTE — Discharge Instructions (Addendum)
Covid/flu test pending Alternate Tylenol and ibuprofen every 4 hours for fever body aches headaches chills Rest and fluids May use over-the-counter cough and congestion medicine as needed for symptoms Follow-up if not improving or worsening

## 2020-09-06 NOTE — ED Triage Notes (Signed)
Pt c/o body aches, headaches, fever, cough, and sore throat x3days.

## 2020-09-07 NOTE — ED Provider Notes (Signed)
[facility]    CSN: [id number] Arrival date & time: 09/06/20  1755      History   Chief Complaint Chief Complaint  Patient presents with  . Cough  . Fever    HPI [name] is a 25 y.o. female history of prolonged QT presenting today for evaluation of fever headache body aches URI symptoms. Symptoms began approximately 3 days ago. Family members with similar symptoms. Fevers have eased off today. Denies any known exposures.  HPI  Past Medical History:  Diagnosis Date  . Prolonged QT interval     Patient Active Problem List   Diagnosis Date Noted  . Pregnancy 03/23/2017    Past Surgical History:  Procedure Laterality Date  . CESAREAN SECTION N/A 03/23/2017   Procedure: CESAREAN SECTION;  Surgeon: Richarda Overlie, MD;  Location: [facility] BIRTHING SUITES;  Service: Obstetrics;  Laterality: N/A;  . MOUTH SURGERY      OB History    Gravida  1   Para  1   Term  1   Preterm      AB      Living  1     SAB      IAB      Ectopic      Multiple  0   Live Births  1            Home Medications    Prior to Admission medications   Medication Sig Start Date End Date Taking? Authorizing Provider  medroxyPROGESTERone (DEPO-PROVERA) 150 MG/ML injection Inject 150 mg into the muscle every 3 (three) months.    [provider]  nadolol (CORGARD) 80 MG tablet Take 80 mg by mouth daily.    [provider]    Family History History reviewed. No pertinent family history.  Social History Social History   Tobacco Use  . Smoking status: Current Every Day Smoker    Packs/day: 0.50    Types: E-cigarettes  . Smokeless tobacco: Never Used  Substance Use Topics  . Alcohol use: Yes    Comment: occ  . Drug use: No     Allergies   Erythromycin and Other   Review of Systems Review of Systems  Constitutional: Positive for chills, fatigue and fever. Negative for activity change and appetite change.  HENT: Positive for  congestion, rhinorrhea and sore throat. Negative for ear pain, sinus pressure and trouble swallowing.   Eyes: Negative for discharge and redness.  Respiratory: Positive for cough. Negative for chest tightness and shortness of breath.   Cardiovascular: Negative for chest pain.  Gastrointestinal: Negative for abdominal pain, diarrhea, nausea and vomiting.  Musculoskeletal: Positive for myalgias.  Skin: Negative for rash.  Neurological: Positive for headaches. Negative for dizziness and light-headedness.     Physical Exam Triage Vital Signs ED Triage Vitals  Enc Vitals Group     BP 09/06/20 1925 125/85     Pulse Rate 09/06/20 1925 96     Resp 09/06/20 1925 18     Temp 09/06/20 1925 98.1 F (36.7 C)     Temp Source 09/06/20 1925 Oral     SpO2 09/06/20 1925 98 %     Weight --      Height --      Head Circumference --      Peak Flow --      Pain Score 09/06/20 1926 7     Pain Loc --      Pain Edu? --  Excl. in GC? --    No data found.  Updated Vital Signs BP 125/85 (BP Location: Left Arm)   Pulse 96   Temp 98.1 F (36.7 C) (Oral)   Resp 18   SpO2 98%   Visual Acuity Right Eye Distance:   Left Eye Distance:   Bilateral Distance:    Right Eye Near:   Left Eye Near:    Bilateral Near:     Physical Exam Vitals and nursing note reviewed.  Constitutional:      Appearance: She is well-developed and well-nourished.     Comments: No acute distress  HENT:     Head: Normocephalic and atraumatic.     Ears:     Comments: Bilateral ears without tenderness to palpation of external auricle, tragus and mastoid, EAC's without erythema or swelling, TM's with good bony landmarks and cone of light. Non erythematous.     Nose: Nose normal.     Mouth/Throat:     Comments: Oral mucosa pink and moist, no tonsillar enlargement or exudate. Posterior pharynx patent and nonerythematous, no uvula deviation or swelling. Normal phonation. Eyes:     Conjunctiva/sclera: Conjunctivae  normal.  Cardiovascular:     Rate and Rhythm: Normal rate and regular rhythm.  Pulmonary:     Effort: Pulmonary effort is normal. No respiratory distress.     Comments: Breathing comfortably at rest, CTABL, no wheezing, rales or other adventitious sounds auscultated Abdominal:     General: There is no distension.  Musculoskeletal:        General: Normal range of motion.     Cervical back: Neck supple.  Skin:    General: Skin is warm and dry.  Neurological:     Mental Status: She is alert and oriented to person, place, and time.  Psychiatric:        Mood and Affect: Mood and affect normal.      UC Treatments / Results  Labs (all labs ordered are listed, but only abnormal results are displayed) Labs Reviewed  COVID-19, FLU A+B NAA    EKG   Radiology No results found.  Procedures Procedures (including critical care time)  Medications Ordered in UC Medications - No data to display  Initial Impression / Assessment and Plan / UC Course  I have reviewed the triage vital signs and the nursing notes.  Pertinent labs & imaging results that were available during my care of the patient were reviewed by me and considered in my medical decision making (see chart for details).     Viral URI with cough-high suspicion of Covid given family members with similar symptoms and associated fevers and body aches. Recommending symptomatic and supportive care. Exam reassuring today. Rest and fluids.  Discussed strict return precautions. Patient verbalized understanding and is agreeable with plan.  Final Clinical Impressions(s) / UC Diagnoses   Final diagnoses:  Encounter for screening laboratory testing for COVID-19 virus  Viral URI with cough     Discharge Instructions     Covid/flu test pending Alternate Tylenol and ibuprofen every 4 hours for fever body aches headaches chills Rest and fluids May use over-the-counter cough and congestion medicine as needed for  symptoms Follow-up if not improving or worsening     ED Prescriptions    None     PDMP not reviewed this encounter.   Lew Dawes, [place] 09/07/20 [phone]

## 2020-09-10 LAB — COVID-19, FLU A+B NAA
Influenza A, NAA: DETECTED — AB
Influenza B, NAA: NOT DETECTED
SARS-CoV-2, NAA: NOT DETECTED

## 2020-11-30 IMAGING — CR DG RIBS W/ CHEST 3+V*R*
3 series · 3 of 3 positions shown · non-contrast
Comparison: None.

CLINICAL DATA: Right chest wall pain.

EXAM:
RIGHT RIBS AND CHEST - 3+ VIEW

[w chest pa]
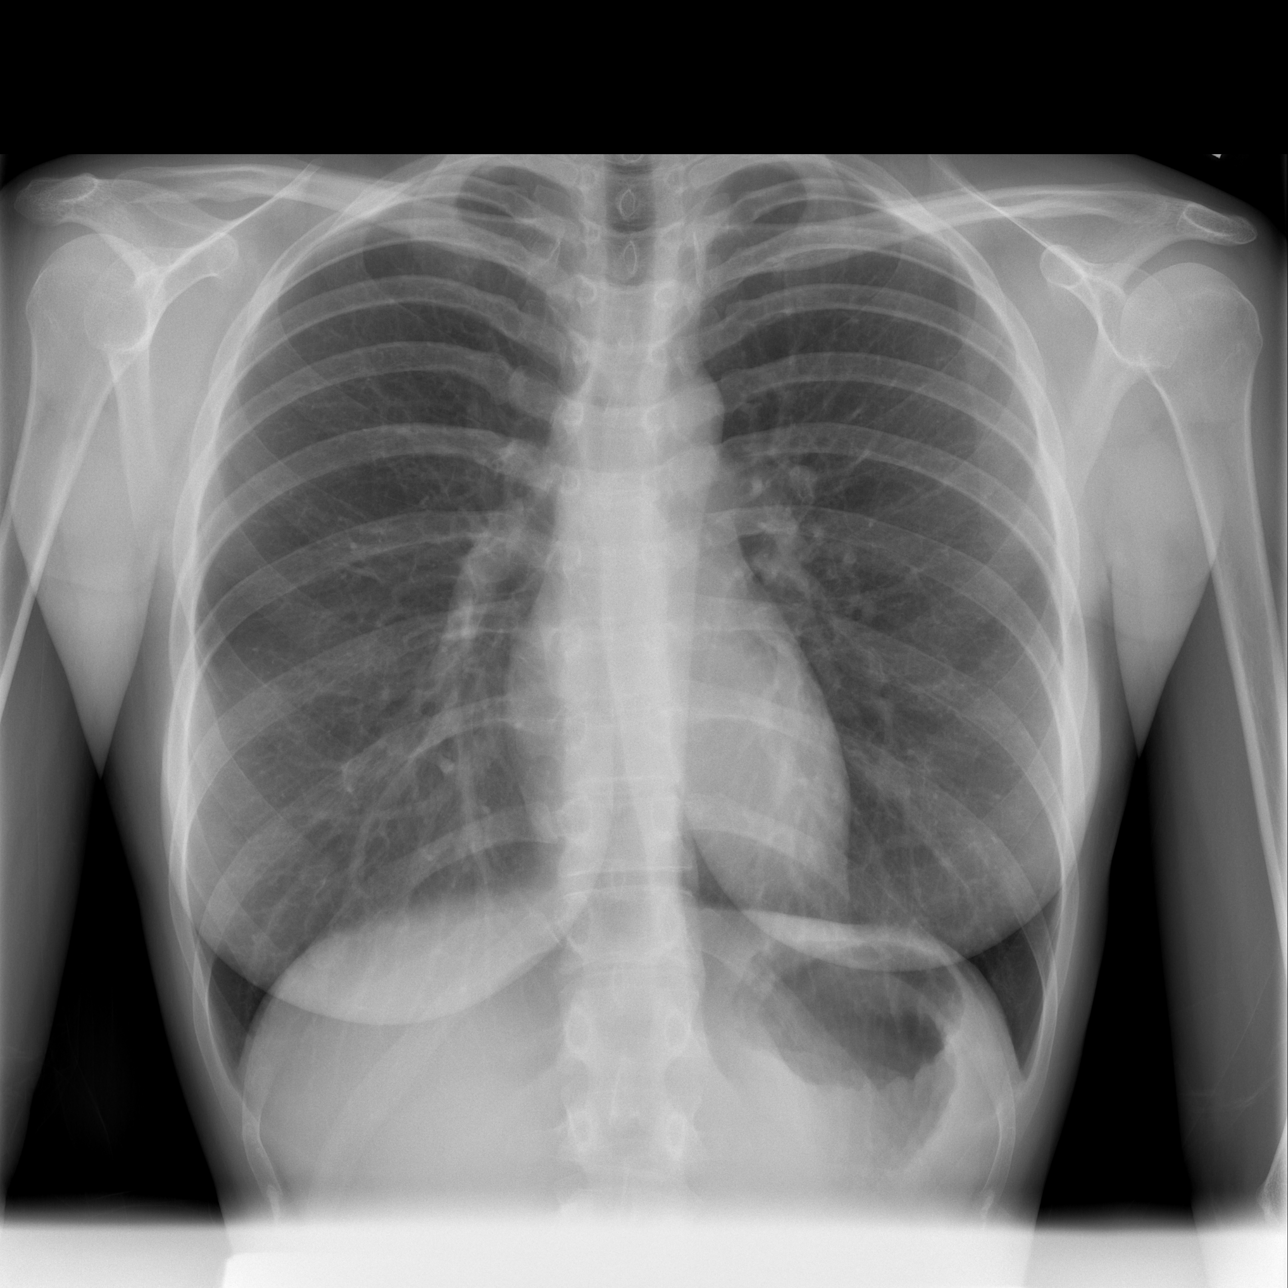

[w ribs ap/pa upper right *]
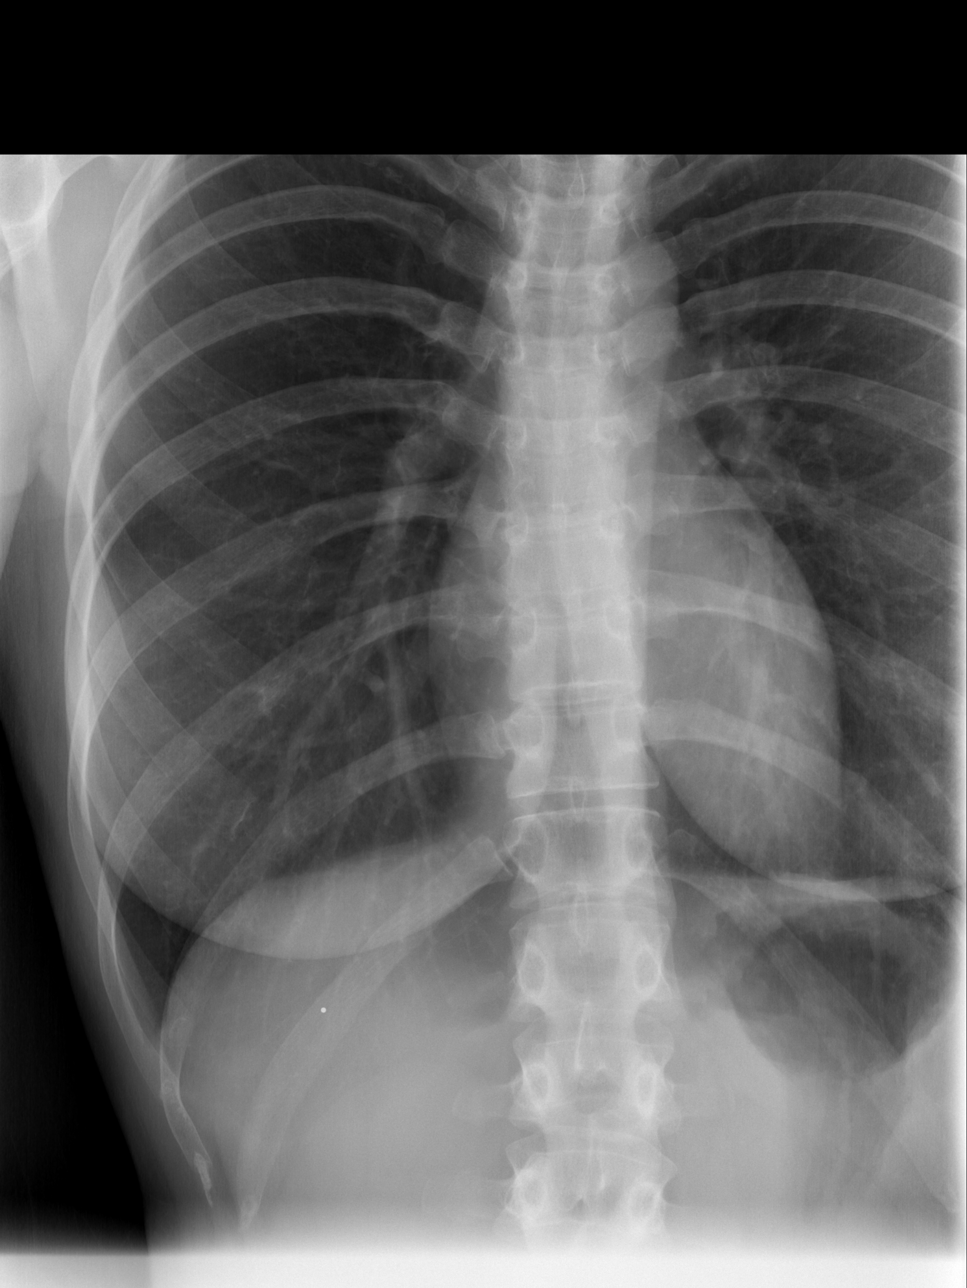

[w ribs oblique right *]
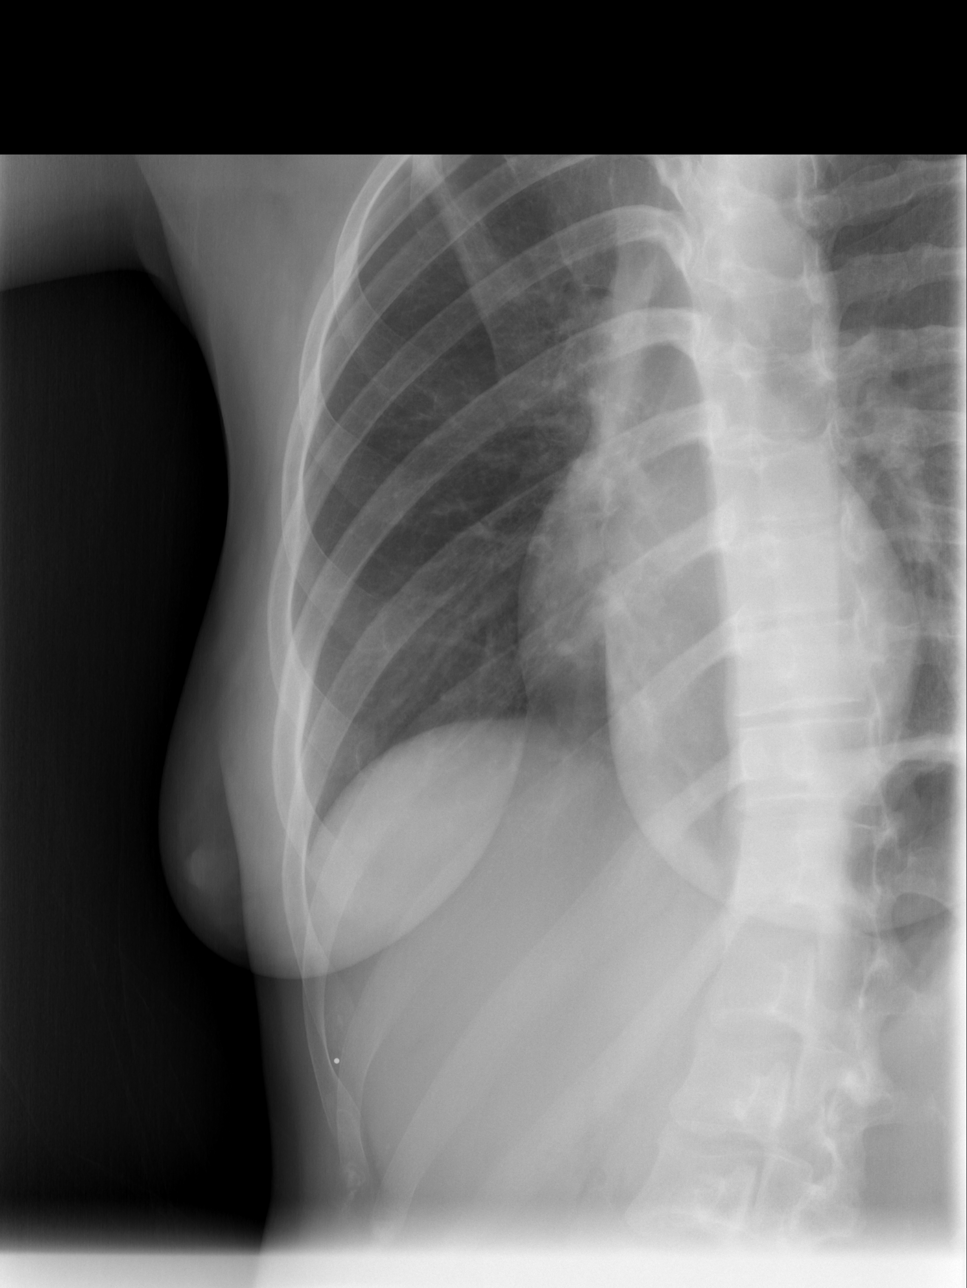

[3 of 3 positions shown; findings below may reference images not displayed]

FINDINGS: No fracture or other bone lesions are seen involving the ribs. There
is no evidence of pneumothorax or pleural effusion. Both lungs are
clear. Heart size and mediastinal contours are within normal limits.
IMPRESSION: Negative radiographs of the chest and right ribs.

## 2022-03-21 ENCOUNTER — Telehealth: Payer: Self-pay

## 2022-03-21 NOTE — Telephone Encounter (Signed)
NOTES SCANNED TO REFERRAL 

## 2022-05-04 ENCOUNTER — Encounter: Payer: Self-pay | Admitting: Cardiovascular Disease

## 2022-05-04 ENCOUNTER — Ambulatory Visit: Payer: Medicaid Other | Attending: Cardiovascular Disease | Admitting: Cardiovascular Disease

## 2022-05-04 VITALS — BP 102/60 | HR 74 | Ht 65.0 in | Wt 141.4 lb

## 2022-05-04 DIAGNOSIS — Z87891 Personal history of nicotine dependence: Secondary | ICD-10-CM

## 2022-05-04 DIAGNOSIS — I4581 Long QT syndrome: Secondary | ICD-10-CM

## 2022-05-04 DIAGNOSIS — Z13220 Encounter for screening for lipoid disorders: Secondary | ICD-10-CM | POA: Diagnosis not present

## 2022-05-04 NOTE — Patient Instructions (Signed)
Medication Instructions:  The current medical regimen is effective;  continue present plan and medications.  *If you need a refill on your cardiac medications before your next appointment, please call your pharmacy*   Lab Work: Fasting lab work (CBC, CMET, TSH, LIPID)  If you have labs (blood work) drawn today and your tests are completely normal, you will receive your results only by: [place] (if you have MyChart) OR A paper copy in the mail If you have any lab test that is abnormal or we need to change your treatment, we will call you to review the results.   Testing/Procedures: Echocardiogram - Your physician has requested that you have an echocardiogram. Echocardiography is a painless test that uses sound waves to create images of your heart. It provides your doctor with information about the size and shape of your heart and how well your heart's chambers and valves are working. This procedure takes approximately one hour. There are no restrictions for this procedure.     Follow-Up: At [facility], you and your health needs are our priority.  As part of our continuing mission to provide you with exceptional heart care, we have created designated Provider Care Teams.  These Care Teams include your primary Cardiologist (physician) and Advanced Practice Providers (APPs -  Physician Assistants and Nurse Practitioners) who all work together to provide you with the care you need, when you need it.  We recommend signing up for the patient portal called "MyChart".  Sign up information is provided on this After Visit Summary.  MyChart is used to connect with patients for Virtual Visits (Telemedicine).  Patients are able to view lab/test results, encounter notes, upcoming appointments, etc.  Non-urgent messages can be sent to your provider as well.   To learn more about what you can do with MyChart, go to [email or web address].    Your next appointment:   12 month(s)  The  format for your next appointment:   In Person  Provider:   Shelva Majestic, MD

## 2022-05-10 ENCOUNTER — Encounter: Payer: Self-pay | Admitting: Cardiovascular Disease

## 2022-05-10 NOTE — Progress Notes (Signed)
Cardiology Office Note    Date:  05/10/2022   ID:  Sandy Rose, DOB [birth year], MRN [id number]  PCP:  Wenda Low, MD  Cardiologist:  Shelva Majestic, MD   New cardiology evaluation  History of Present Illness:  Sandy Rose is a 26 y.o. female who is the daughter of my patient, Sandy Rose.  She has a history of long QT syndrome type I and has been followed at [facility] pediatric cardiology by Dr. Crissie Reese.  With her age now at 18 years, she presents to establish cardiology care with me.  Sandy Rose's mother, Sandy Rose, developed toxic shock syndrome at age 69.  At that time she was hospitalized on the medical service and while receiving a dose of intravenous erythromycin developed torsade du Pointes with polymorphic ventricular tachycardia.  She was successfully resuscitated.  She was found to have mitral valve prolapse with prolonged QTc syndrome and consequently has been on beta-blocker ever since.  Apparently, Sandy Rose's mother and grandmother were gene positive for the Clois Dupes and Q1 gene mutation (Gly269Asp).  Except for 1 episode of syncope while at a young age while climbing monkey bars, essentially Sandy Rose had been asymptomatic ever since on beta-blocker therapy.  Sandy Rose who has tested negative.  Sandy Rose has been followed by Dr. Neena Rhymes and [facility] pediatric cardiac electrophysiology and has been maintained on nadolol.  When last evaluated by Dr. Neena Rhymes in July 2022, her QTc interval was prolonged at 491 and she had broad-based T waves.  At that time, she tells me her nadolol dose was increased to 80 mg.  Remotely she had worn a Holter monitor several years ago and was told this was stable.  Over the past year, Sandy Rose remains asymptomatic.  She works at a [employer] as a Investment banker, operational.  She has a smoking history and does vape for the last several years.  She does drink alcohol. She exercises approximately 6 days/week.   She presents for her initial adult cardiology evaluation with me.   Past Medical History:  Diagnosis Date   Prolonged QT interval     Past Surgical History:  Procedure Laterality Date   CESAREAN SECTION N/A 03/23/2017   Procedure: CESAREAN SECTION;  Surgeon: Molli Posey, MD;  Location: [place];  Service: Obstetrics;  Laterality: N/A;   MOUTH SURGERY      Current Medications: Outpatient Medications Prior to Visit  Medication Sig Dispense Refill   nadolol (CORGARD) 80 MG tablet Take 80 mg by mouth daily.     medroxyPROGESTERone (DEPO-PROVERA) 150 MG/ML injection Inject 150 mg into the muscle every 3 (three) months. (Patient not taking: Reported on 05/04/2022)     No facility-administered medications prior to visit.     Allergies:   Erythromycin and Other   Social History   Socioeconomic History   Marital status: Single    Spouse name: Not on file   Number of children: Not on file   Years of education: Not on file   Highest education level: Not on file  Occupational History   Not on file  Tobacco Use   Smoking status: Every Day    Packs/day: 0.50    Types: E-cigarettes, Cigarettes   Smokeless tobacco: Never  Substance and Sexual Activity   Alcohol use: Yes    Comment: occ   Drug use: No   Sexual activity: Yes    Birth control/protection: Injection  Other Topics Concern  Not on file  Social History Narrative   Not on file   Social Determinants of Health   Financial Resource Strain: Not on file  Food Insecurity: Not on file  Transportation Needs: Not on file  Physical Activity: Not on file  Stress: Not on file  Social Connections: Not on file    She is single.  She has 1 child age 60 years old, Sandy Rose, who tested negative for long QTc gene mutation.  Family History: Family history is notable that her mother is now 31 years old and was diagnosed with long QTc at age 35 following an episode of polymorphic ventricular tachycardia after receiving  IV erythromycin for toxic syndrome.  Her father is 10 years old.  Her grandmother and great-grandmother have prolonged QT in addition to her mother.  ROS General: Negative; No fevers, chills, or night sweats;  HEENT: Negative; No changes in vision or hearing, sinus congestion, difficulty swallowing Pulmonary: Negative; No cough, wheezing, shortness of breath, hemoptysis Cardiovascular: See HPI GI: Negative; No nausea, vomiting, diarrhea, or abdominal pain GU: Negative; No dysuria, hematuria, or difficulty voiding Musculoskeletal: Negative; no myalgias, joint pain, or weakness Hematologic/Oncology: Negative; no easy bruising, bleeding Endocrine: Negative; no heat/cold intolerance; no diabetes Neuro: Negative; no changes in balance, headaches Skin: Negative; No rashes or skin lesions Psychiatric: Negative; No behavioral problems, depression Sleep: Negative; No snoring, daytime sleepiness, hypersomnolence, bruxism, restless legs, hypnogognic hallucinations, no cataplexy Other comprehensive 14 point system review is negative.   PHYSICAL EXAM:   VS:  BP 102/60   Pulse 74   Ht 5' 5"  (1.651 m)   Wt 141 lb 6.4 oz (64.1 kg)   SpO2 96%   BMI 23.53 kg/m     Repeat blood pressure by me was 104/60 supine and 96/60 standing  Wt Readings from Last 3 Encounters:  05/04/22 141 lb 6.4 oz (64.1 kg)  03/23/17 154 lb (69.9 kg)  04/12/16 122 lb 8 oz (55.6 kg)    General: Alert, oriented, no distress.  Skin: normal turgor, no rashes, warm and dry HEENT: Normocephalic, atraumatic. Pupils equal round and reactive to light; sclera anicteric; extraocular muscles intact; Nose without nasal septal hypertrophy Mouth/Parynx benign; Mallinpatti scale 2 Neck: No JVD, no carotid bruits; normal carotid upstroke Lungs: clear to ausculatation and percussion; no wheezing or rales Chest wall: without tenderness to palpitation Heart: PMI not displaced, RRR, s1 s2 normal, 1/6 systolic murmur, no diastolic murmur,  no rubs, gallops, thrills, or heaves Abdomen: soft, nontender; no hepatosplenomehaly, BS+; abdominal aorta nontender and not dilated by palpation. Back: no CVA tenderness Pulses 2+ Musculoskeletal: full range of motion, normal strength, no joint deformities Extremities: no clubbing cyanosis or edema, Homan's sign negative  Neurologic: grossly nonfocal; Cranial nerves grossly wnl Psychologic: Normal mood and affect   Studies/Labs Reviewed:   May 04, 2022 ECG (independently read by me): Normal sinus rhythm at 74 bpm.  Suggestion of borderline biatrial enlargement.  QTc interval 468 ms.  No ectopy.  Broad-based T waves.  Recent Labs:    Latest Ref Rng & Units 04/12/2016   11:14 PM 07/11/2010   12:44 PM  BMP  Glucose 65 - 99 mg/dL 99  114   BUN 6 - 20 mg/dL 7  7   Creatinine 0.44 - 1.00 mg/dL 0.60  0.66   Sodium 135 - 145 mmol/L 139  139   Potassium 3.5 - 5.1 mmol/L 4.0  4.0   Chloride 101 - 111 mmol/L 101  104   CO2 19 -  32 mEq/L  26   Calcium 8.4 - 10.5 mg/dL  9.4         Latest Ref Rng & Units 07/12/2010    6:35 AM  Hepatic Function  Total Protein 6.0 - 8.3 g/dL 7.1   Albumin 3.5 - 5.2 g/dL 4.0   AST 0 - 37 U/L 17   ALT 0 - 35 U/L 10   Alk Phosphatase 47 - 119 U/L 56   Total Bilirubin 0.3 - 1.2 mg/dL 0.5   Bilirubin, Direct 0.0 - 0.3 mg/dL 0.1        Latest Ref Rng & Units 03/24/2017    5:51 AM 03/23/2017   12:30 PM 04/12/2016   11:14 PM  CBC  WBC 4.0 - 10.5 K/uL 12.3  8.5    Hemoglobin 12.0 - 15.0 g/dL 11.3  11.5  16.3   Hematocrit 36.0 - 46.0 % 32.4  34.2  48.0   Platelets 150 - 400 K/uL 192  246     Lab Results  Component Value Date   MCV 86.6 03/24/2017   MCV 87.0 03/23/2017   MCV 81.7 07/11/2010   Lab Results  Component Value Date   TSH 1.596 07/12/2010   No results found for: "HGBA1C"   BNP No results found for: "BNP"  ProBNP No results found for: "PROBNP"   Lipid Panel  No results found for: "CHOL", "TRIG", "HDL", "CHOLHDL", "VLDL",  "LDLCALC", "LDLDIRECT", "LABVLDL"   RADIOLOGY: No results found.   Additional studies/ records that were reviewed today include:   I reviewed the records children's pediatric specialty of Dr. Crissie Reese l.   ASSESSMENT:    1. 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 Family history of prolonged QT interval syndrome   3. Lipid screening   4. History of tobacco use     PLAN:  Latravia Southgate is a very pleasant 26 year old female who is the daughter of my patient Sandy Rose.  Her mother was diagnosed with long QTc syndrome after she developed an episode of polymorphic ventricular tachycardia when she received IV erythromycin at age 46 in the setting of toxic shock syndrome.  She was found to have long QTc syndrome and mitral valve prolapse.  Nur is positive for long QT syndrome type I, KC and every gene mutation (Gly 269 Asp) and has been followed at [facility] pediatrics along with her sister throughout her childhood.  And lessons in early adulthood.  When last evaluated in July 2022, QTc was 491} T waves.  She has been on malleable and her dose most recently is 80 mg daily.  The patient has had discussion with Dr. Neena Rhymes concerning effects of vaping, alcohol, and marijuana.  I again discussed these today.  Her ECG today shows a QTc interval slightly improved now at 468.  There is a suggestion of possible biatrial enlargement.  Presently, I am recommending she undergo a 2D echo Doppler study for baseline assessment of wall motion, chamber dimensions and valvular architecture.  I am also scheduling her for fasting laboratory including a comprehensive metabolic panel, CBC, TSH and lipid studies.  She remains asymptomatic.  I will see her in 1 year for reevaluation or sooner if needed.   Medication Adjustments/Labs and Tests Ordered: Current medicines are reviewed at length with the patient today.  Concerns regarding medicines are outlined above.  Medication changes,  Labs and Tests ordered today are listed in the Patient Instructions below. Patient Instructions  Medication Instructions:  The current medical  regimen is effective;  continue present plan and medications.  *If you need a refill on your cardiac medications before your next appointment, please call your pharmacy*   Lab Work: Fasting lab work (CBC, CMET, TSH, LIPID)  If you have labs (blood work) drawn today and your tests are completely normal, you will receive your results only by: [place] (if you have MyChart) OR A paper copy in the mail If you have any lab test that is abnormal or we need to change your treatment, we will call you to review the results.   Testing/Procedures: Echocardiogram - Your physician has requested that you have an echocardiogram. Echocardiography is a painless test that uses sound waves to create images of your heart. It provides your doctor with information about the size and shape of your heart and how well your heart's chambers and valves are working. This procedure takes approximately one hour. There are no restrictions for this procedure.     Follow-Up: At [facility], you and your health needs are our priority.  As part of our continuing mission to provide you with exceptional heart care, we have created designated Provider Care Teams.  These Care Teams include your primary Cardiologist (physician) and Advanced Practice Providers (APPs -  Physician Assistants and Nurse Practitioners) who all work together to provide you with the care you need, when you need it.  We recommend signing up for the patient portal called "MyChart".  Sign up information is provided on this After Visit Summary.  MyChart is used to connect with patients for Virtual Visits (Telemedicine).  Patients are able to view lab/test results, encounter notes, upcoming appointments, etc.  Non-urgent messages can be sent to your provider as well.   To learn more about what you can  do with MyChart, go to [email or web address].    Your next appointment:   12 month(s)  The format for your next appointment:   In Person  Provider:   Shelva Majestic, MD          Signed, Shelva Majestic, MD  05/10/2022 8:06 [place] [address] [place], [place], [place]  [address] Phone: [phone]

## 2022-05-11 ENCOUNTER — Ambulatory Visit (HOSPITAL_COMMUNITY): Payer: Medicaid Other | Attending: Cardiovascular Disease

## 2022-05-11 DIAGNOSIS — I4581 Long QT syndrome: Secondary | ICD-10-CM | POA: Diagnosis present

## 2022-05-11 LAB — ECHOCARDIOGRAM COMPLETE
Area-P 1/2: 3.83 cm2
S' Lateral: 2.4 cm

## 2022-08-01 ENCOUNTER — Ambulatory Visit: Admit: 2022-08-01 | Discharge status: Absent without leave | Payer: Medicaid Other

## 2022-09-18 ENCOUNTER — Emergency Department (HOSPITAL_BASED_OUTPATIENT_CLINIC_OR_DEPARTMENT_OTHER)
Admission: EM | Admit: 2022-09-18 | Discharge: 2022-09-18 | Disposition: A | Discharge status: Home / Self Care | Payer: Medicaid Other | Attending: Emergency Medicine | Admitting: Emergency Medicine

## 2022-09-18 ENCOUNTER — Encounter (HOSPITAL_BASED_OUTPATIENT_CLINIC_OR_DEPARTMENT_OTHER): Payer: Self-pay | Admitting: Emergency Medicine

## 2022-09-18 ENCOUNTER — Other Ambulatory Visit: Payer: Self-pay

## 2022-09-18 DIAGNOSIS — R220 Localized swelling, mass and lump, head: Secondary | ICD-10-CM | POA: Diagnosis present

## 2022-09-18 DIAGNOSIS — R519 Headache, unspecified: Secondary | ICD-10-CM

## 2022-09-18 DIAGNOSIS — Z79899 Other long term (current) drug therapy: Secondary | ICD-10-CM | POA: Diagnosis not present

## 2022-09-18 NOTE — ED Triage Notes (Addendum)
Pt presents to ED POV. Pt c/o bump that she found on the left side of her head just undre hair line. Pt reports that she found it ~1200 am. Pt reports no pain/itchiness. Pt denies ear or tooth pain. Denies injury/insult

## 2022-09-18 NOTE — ED Provider Notes (Signed)
[facility] Provider Note   CSN: [id number] Arrival date & time: 09/18/22  0208     History  No chief complaint on file.   Sandy Rose is a 27 y.o. female.  HPI     This is a 27 year old female who presents with concerns for a lump on the side of her head.  Patient reports earlier this evening she was running her fingers through her hair and she noted on a lump at the left temple.  It was nonpainful.  She thinks that it was red and irritated appearing.  Has not had any fevers.  No headache.  No vision changes.  Denies trauma.  She states she looked it up and was concerned it may be something serious.  This is why she come to the emergency department.  Home Medications Prior to Admission medications   Medication Sig Start Date End Date Taking? Authorizing Provider  nadolol (CORGARD) 80 MG tablet Take 80 mg by mouth daily.    [provider]      Allergies    Erythromycin and Other    Review of Systems   Review of Systems  Constitutional:  Negative for fever.  Skin:  Positive for color change.  All other systems reviewed and are negative.   Physical Exam Updated Vital Signs BP (!) 135/95   Pulse 68   Temp 98.2 F (36.8 C) (Oral)   Resp 18   LMP 07/29/2022 (Approximate)   SpO2 99%  Physical Exam Vitals and nursing note reviewed.  Constitutional:      Appearance: Normal appearance. She is well-developed.  HENT:     Head: Normocephalic and atraumatic.     Comments: No palpable lumps or masses in the temporal region, no tenderness to palpation, no overlying skin changes    Ears:     Comments: Bilateral TMs clear    Mouth/Throat:     Mouth: Mucous membranes are moist.  Eyes:     Extraocular Movements: Extraocular movements intact.     Pupils: Pupils are equal, round, and reactive to light.  Cardiovascular:     Rate and Rhythm: Normal rate and regular rhythm.  Pulmonary:     Effort: Pulmonary effort is  normal. No respiratory distress.  Abdominal:     Palpations: Abdomen is soft.  Musculoskeletal:     Cervical back: Neck supple.  Skin:    General: Skin is warm and dry.  Neurological:     Mental Status: She is alert and oriented to person, place, and time.  Psychiatric:        Mood and Affect: Mood normal.     ED Results / Procedures / Treatments   Labs (all labs ordered are listed, but only abnormal results are displayed) Labs Reviewed - No data to display  EKG None  Radiology No results found.  Procedures Procedures    Medications Ordered in ED Medications - No data to display  ED Course/ Medical Decision Making/ A&P                             Medical Decision Making  This patient presents to the ED for concern of lump on left head, this involves an extensive number of treatment options, and is a complaint that carries with it a high risk of complications and morbidity.  I considered the following differential and admission for this acute, potentially life threatening condition.  The  differential diagnosis includes abscess, cellulitis, hematoma  MDM:    Patient presents with a lump in the left temporal region.  She is nontoxic and vital signs are reassuring.  Denies pain or any other symptoms.  On my physical exam I cannot appreciate any asymmetry.  No lumps or bumps palpated.  No pain.  No overlying skin changes.  No evidence of abscess or cellulitis.  Bilateral TMs are clear.  No temporal artery tenderness.  Unclear what caused the patient's symptoms prior to arrival but that seem to have resolved.  (Labs, imaging, consults)  Labs: I Ordered, and personally interpreted labs.  The pertinent results include: None  Imaging Studies ordered: I ordered imaging studies including none I independently visualized and interpreted imaging. I agree with the radiologist interpretation  Additional history obtained from chart review.  External records from outside source  obtained and reviewed including prior evaluations  Cardiac Monitoring: The patient was not maintained on a cardiac monitor.  If on the cardiac monitor, I personally viewed and interpreted the cardiac monitored which showed an underlying rhythm of: N/A  Reevaluation: After the interventions noted above, I reevaluated the patient and found that they have :resolved  Social Determinants of Health:  lives independently  Disposition: Discharge  Co morbidities that complicate the patient evaluation  Past Medical History:  Diagnosis Date   Prolonged QT interval      Medicines No orders of the defined types were placed in this encounter.   I have reviewed the patients home medicines and have made adjustments as needed  Problem List / ED Course: Problem List Items Addressed This Visit   None Visit Diagnoses     Nonintractable headache, unspecified chronicity pattern, unspecified headache type    -  Primary                   Final Clinical Impression(s) / ED Diagnoses Final diagnoses:  Nonintractable headache, unspecified chronicity pattern, unspecified headache type    Rx / DC Orders ED Discharge Orders     None         Merryl Hacker, MD 09/18/22 [phone]

## 2022-09-18 NOTE — Discharge Instructions (Signed)
You are seen today for a bump on the side of your head.  On my evaluation, your exam had essentially normalized.  Unclear what this may have been from.  Monitor symptoms closely.  If it recurs, you note redness, fever, increasing pain, you should be reevaluated by your primary physician.

## 2023-01-04 ENCOUNTER — Ambulatory Visit (HOSPITAL_COMMUNITY)
Admission: EM | Admit: 2023-01-04 | Discharge: 2023-01-04 | Disposition: A | Discharge status: Home / Self Care | Payer: Medicaid Other | Attending: Physician Assistant | Admitting: Physician Assistant

## 2023-01-04 ENCOUNTER — Encounter (HOSPITAL_COMMUNITY): Payer: Self-pay | Admitting: Emergency Medicine

## 2023-01-04 DIAGNOSIS — R1011 Right upper quadrant pain: Secondary | ICD-10-CM

## 2023-01-04 DIAGNOSIS — K641 Second degree hemorrhoids: Secondary | ICD-10-CM

## 2023-01-04 DIAGNOSIS — R197 Diarrhea, unspecified: Secondary | ICD-10-CM | POA: Diagnosis present

## 2023-01-04 DIAGNOSIS — E860 Dehydration: Secondary | ICD-10-CM | POA: Diagnosis present

## 2023-01-04 LAB — CBC WITH DIFFERENTIAL/PLATELET
Abs Immature Granulocytes: 0.02 10*3/uL (ref 0.00–0.07)
Basophils Absolute: 0 10*3/uL (ref 0.0–0.1)
Basophils Relative: 0 %
Eosinophils Absolute: 0.4 10*3/uL (ref 0.0–0.5)
Eosinophils Relative: 5 %
HCT: 42.6 % (ref 36.0–46.0)
Hemoglobin: 14.7 g/dL (ref 12.0–15.0)
Immature Granulocytes: 0 %
Lymphocytes Relative: 20 %
Lymphs Abs: 1.7 10*3/uL (ref 0.7–4.0)
MCH: 32.5 pg (ref 26.0–34.0)
MCHC: 34.5 g/dL (ref 30.0–36.0)
MCV: 94 fL (ref 80.0–100.0)
Monocytes Absolute: 0.8 10*3/uL (ref 0.1–1.0)
Monocytes Relative: 9 %
Neutro Abs: 5.9 10*3/uL (ref 1.7–7.7)
Neutrophils Relative %: 66 %
Platelets: 249 10*3/uL (ref 150–400)
RBC: 4.53 MIL/uL (ref 3.87–5.11)
RDW: 12.4 % (ref 11.5–15.5)
WBC: 8.8 10*3/uL (ref 4.0–10.5)
nRBC: 0 % (ref 0.0–0.2)

## 2023-01-04 LAB — COMPREHENSIVE METABOLIC PANEL
ALT: 27 U/L (ref 0–44)
AST: 51 U/L — ABNORMAL HIGH (ref 15–41)
Albumin: 4.2 g/dL (ref 3.5–5.0)
Alkaline Phosphatase: 92 U/L (ref 38–126)
Anion gap: 9 (ref 5–15)
BUN: 9 mg/dL (ref 6–20)
CO2: 24 mmol/L (ref 22–32)
Calcium: 8.9 mg/dL (ref 8.9–10.3)
Chloride: 101 mmol/L (ref 98–111)
Creatinine, Ser: 0.65 mg/dL (ref 0.44–1.00)
GFR, Estimated: >60 mL/min (ref >60)
Glucose, Bld: 92 mg/dL (ref 70–99)
Potassium: 3.8 mmol/L (ref 3.5–5.1)
Sodium: 134 mmol/L — ABNORMAL LOW (ref 135–145)
Total Bilirubin: 1.6 mg/dL — ABNORMAL HIGH (ref 0.3–1.2)
Total Protein: 7.1 g/dL (ref 6.5–8.1)

## 2023-01-04 LAB — POCT URINALYSIS DIP (MANUAL ENTRY)
Blood, UA: NEGATIVE
Glucose, UA: NEGATIVE mg/dL
Leukocytes, UA: NEGATIVE
Nitrite, UA: NEGATIVE
Protein Ur, POC: 30 mg/dL — AB
Spec Grav, UA: >=1.03 — AB (ref 1.010–1.025)
Urobilinogen, UA: 1 E.U./dL
pH, UA: 6.5 (ref 5.0–8.0)

## 2023-01-04 LAB — LIPASE, BLOOD: Lipase: 85 U/L — ABNORMAL HIGH (ref 11–51)

## 2023-01-04 LAB — POCT URINE PREGNANCY: Preg Test, Ur: NEGATIVE

## 2023-01-04 LAB — POC HEMOCCULT BLD/STL (OFFICE/1-CARD/DIAGNOSTIC): Fecal Occult Blood, POC: NEGATIVE

## 2023-01-04 MED ORDER — HYDROCORTISONE ACETATE 25 MG RE SUPP: 25.0000 mg | Freq: Two times a day (BID) | RECTAL | 0 refills | Status: DC

## 2023-01-04 NOTE — ED Triage Notes (Signed)
Pt c/o RUQ pain "for while, I drink a lot" (drinks about 4-5 alcohol drinks that aer 10%/day and vapes). "[place] in RUQ for a year". PCP did blood work and was all good. Hasn't had any scans on abd.  Adds that for past 2 days having "watery, mucousy diarrhea". Also have intermittent nausea and vomiting esp after vaping. Did notice blood in stools.  Took IBU yesterday when at work. Used hemorrhoid cream to see if help.

## 2023-01-04 NOTE — Discharge Instructions (Addendum)
Drink water and gatoraid.  Imodium one dosage.  See your Physician for recheck on Monday.  Go to the Emergency department if symptoms worsen

## 2023-01-04 NOTE — ED Provider Notes (Signed)
[facility]    CSN: [id number] Arrival date & time: 01/04/23  1023      History   Chief Complaint Chief Complaint  Patient presents with   Abdominal Pain   Diarrhea    HPI Sandy Rose is a 27 y.o. female.   Patient complains of diarrhea for the past 2 days.  Patient reports she has had multiple episodes.  Patient reports she saw a small amount of blood with diarrhea.  Patient reports she has some rectal irritation.  Patient thinks that she may have a hemorrhoid.  Patient also complains of abdominal discomfort.  Patient reports that she drinks approximately 5 alcoholic beverages a day.  Patient denies any fever or chills she has had some vomiting.  The history is provided by the patient. No language interpreter was used.  Abdominal Pain Pain location:  Generalized Pain quality: aching   Pain radiates to:  Does not radiate Pain severity:  Moderate Onset quality:  Gradual Duration:  2 days Timing:  Constant Progression:  Worsening Associated symptoms: diarrhea   Diarrhea Associated symptoms: abdominal pain     Past Medical History:  Diagnosis Date   Prolonged QT interval     Patient Active Problem List   Diagnosis Date Noted   Pregnancy 03/23/2017    Past Surgical History:  Procedure Laterality Date   CESAREAN SECTION N/A 03/23/2017   Procedure: CESAREAN SECTION;  Surgeon: Richarda Overlie, MD;  Location: [facility];  Service: Obstetrics;  Laterality: N/A;   MOUTH SURGERY      OB History     Gravida  1   Para  1   Term  1   Preterm      AB      Living  1      SAB      IAB      Ectopic      Multiple  0   Live Births  1            Home Medications    Prior to Admission medications   Medication Sig Start Date End Date Taking? Authorizing Provider  hydrocortisone (ANUSOL-HC) 25 MG suppository Place 1 suppository (25 mg total) rectally 2 (two) times daily. 01/04/23  Yes Cheron Schaumann K, PA-C  nadolol (CORGARD)  80 MG tablet Take 80 mg by mouth daily.    [provider]    Family History No family history on file.  Social History Social History   Tobacco Use   Smoking status: Every Day    Packs/day: .5    Types: E-cigarettes, Cigarettes   Smokeless tobacco: Never  Substance Use Topics   Alcohol use: Yes    Comment: occ   Drug use: No     Allergies   Erythromycin and Other   Review of Systems Review of Systems  Gastrointestinal:  Positive for abdominal pain and diarrhea.  All other systems reviewed and are negative.    Physical Exam Triage Vital Signs ED Triage Vitals  Enc Vitals Group     BP 01/04/23 1143 121/84     Pulse Rate 01/04/23 1143 67     Resp 01/04/23 1143 16     Temp 01/04/23 1143 98.4 F (36.9 C)     Temp Source 01/04/23 1143 Oral     SpO2 01/04/23 1143 97 %     Weight --      Height --      Head Circumference --      Peak  Flow --      Pain Score 01/04/23 1140 6     Pain Loc --      Pain Edu? --      Excl. in GC? --    No data found.  Updated Vital Signs BP 121/84 (BP Location: Left Arm)   Pulse 67   Temp 98.4 F (36.9 C) (Oral)   Resp 16   LMP 11/29/2022 (Approximate)   SpO2 97%   Visual Acuity Right Eye Distance:   Left Eye Distance:   Bilateral Distance:    Right Eye Near:   Left Eye Near:    Bilateral Near:     Physical Exam Vitals and nursing note reviewed.  Constitutional:      Appearance: She is well-developed.  HENT:     Head: Normocephalic.  Cardiovascular:     Rate and Rhythm: Normal rate and regular rhythm.  Pulmonary:     Effort: Pulmonary effort is normal.  Abdominal:     General: Abdomen is flat. Bowel sounds are normal. There is no distension.     Palpations: Abdomen is soft.     Tenderness: There is generalized abdominal tenderness.  Genitourinary:    Comments: Small external hemorrhoid palpable internal hemorrhoid.  Hemoccult negative Musculoskeletal:        General: Normal range of motion.      Cervical back: Normal range of motion.  Skin:    General: Skin is warm.  Neurological:     General: No focal deficit present.     Mental Status: She is alert and oriented to person, place, and time.      UC Treatments / Results  Labs (all labs ordered are listed, but only abnormal results are displayed) Labs Reviewed  COMPREHENSIVE METABOLIC PANEL - Abnormal; Notable for the following components:      Result Value   Sodium 134 (*)    AST 51 (*)    Total Bilirubin 1.6 (*)    All other components within normal limits  LIPASE, BLOOD - Abnormal; Notable for the following components:   Lipase 85 (*)    All other components within normal limits  POCT URINALYSIS DIP (MANUAL ENTRY) - Abnormal; Notable for the following components:   Color, UA straw (*)    Clarity, UA cloudy (*)    Bilirubin, UA moderate (*)    Ketones, POC UA >= (160) (*)    Spec Grav, UA >=1.030 (*)    Protein Ur, POC =30 (*)    All other components within normal limits  CBC WITH DIFFERENTIAL/PLATELET  POCT URINE PREGNANCY  POC HEMOCCULT BLD/STL (OFFICE/1-CARD/DIAGNOSTIC)  POC HEMOCCULT BLD/STL (OFFICE/1-CARD/DIAGNOSTIC)    EKG   Radiology No results found.  Procedures Procedures (including critical care time)  Medications Ordered in UC Medications - No data to display  Initial Impression / Assessment and Plan / UC Course  I have reviewed the triage vital signs and the nursing notes.  Pertinent labs & imaging results that were available during my care of the patient were reviewed by me and considered in my medical decision making (see chart for details).      Final Clinical Impressions(s) / UC Diagnoses   Final diagnoses:  Diarrhea, unspecified type  Grade II hemorrhoids  Right upper quadrant abdominal pain  Dehydration     Discharge Instructions      Drink water and gatoraid.  Imodium one dosage.  See your Physician for recheck on Monday.  Go to the Emergency department if symptoms  worsen      ED Prescriptions     Medication Sig Dispense Auth. Provider   hydrocortisone (ANUSOL-HC) 25 MG suppository Place 1 suppository (25 mg total) rectally 2 (two) times daily. 12 suppository Elson Areas, [place]      PDMP not reviewed this encounter. An After Visit Summary was printed and given to the patient.    Elson Areas, [place] 01/04/23 [phone]

## 2023-01-08 ENCOUNTER — Other Ambulatory Visit (HOSPITAL_COMMUNITY): Payer: Self-pay | Admitting: Internal Medicine

## 2023-01-08 DIAGNOSIS — R7989 Other specified abnormal findings of blood chemistry: Secondary | ICD-10-CM

## 2023-01-09 ENCOUNTER — Ambulatory Visit (HOSPITAL_COMMUNITY): Payer: Medicaid Other

## 2023-01-09 ENCOUNTER — Encounter (HOSPITAL_COMMUNITY): Payer: Self-pay

## 2023-05-10 ENCOUNTER — Telehealth: Payer: Self-pay | Admitting: Cardiovascular Disease

## 2023-05-10 NOTE — Telephone Encounter (Signed)
*  STAT* If patient is at the pharmacy, call can be transferred to refill team.   1. Which medications need to be refilled? (please list name of each medication and dose if known) nadolol (CORGARD) 80 MG tablet    2. Would you like to learn more about the convenience, safety, & potential cost savings by using the [facility]? No      3. Are you open to using the [facility] (Type [facility]. No ).   4. Which pharmacy/location (including street and city if local pharmacy) is medication to be sent to? [id number] - [place], [place] - [address]    5. Do they need a 30 day or 90 day supply? 90

## 2023-05-11 MED ORDER — NADOLOL 80 MG PO TABS: 80.0000 mg | ORAL_TABLET | Freq: Every day | ORAL | 0 refills | Status: DC

## 2023-05-11 NOTE — Telephone Encounter (Signed)
Refills has been sent to the pharmacy. 

## 2023-08-15 ENCOUNTER — Ambulatory Visit: Payer: Medicaid Other | Attending: Cardiovascular Disease | Admitting: Cardiovascular Disease

## 2023-08-22 ENCOUNTER — Ambulatory Visit (HOSPITAL_COMMUNITY)
Admission: EM | Admit: 2023-08-22 | Discharge: 2023-08-22 | Discharge status: Absent without leave | Payer: Medicaid Other

## 2024-05-07 DEATH — deceased
